# Patient Record
Sex: Female | Born: 1980 | Hispanic: No | Marital: Single | Smoking: Never smoker
Health system: Southern US, Community
[De-identification: ages and names within clinical notes are randomized; demographics above are authoritative.]

## PROBLEM LIST (undated history)

## (undated) DIAGNOSIS — J309 Allergic rhinitis, unspecified: Secondary | ICD-10-CM

## (undated) DIAGNOSIS — J45909 Unspecified asthma, uncomplicated: Secondary | ICD-10-CM

## (undated) DIAGNOSIS — E559 Vitamin D deficiency, unspecified: Secondary | ICD-10-CM

## (undated) DIAGNOSIS — F419 Anxiety disorder, unspecified: Secondary | ICD-10-CM

## (undated) HISTORY — DX: Vitamin D deficiency, unspecified: E55.9

## (undated) HISTORY — DX: Allergic rhinitis, unspecified: J30.9

## (undated) HISTORY — DX: Unspecified asthma, uncomplicated: J45.909

## (undated) HISTORY — PX: NO PAST SURGERIES: SHX2092

---

## 2015-12-23 DIAGNOSIS — J45909 Unspecified asthma, uncomplicated: Secondary | ICD-10-CM | POA: Insufficient documentation

## 2015-12-23 DIAGNOSIS — Z0000 Encounter for general adult medical examination without abnormal findings: Secondary | ICD-10-CM | POA: Insufficient documentation

## 2015-12-23 DIAGNOSIS — J040 Acute laryngitis: Secondary | ICD-10-CM | POA: Insufficient documentation

## 2016-11-30 ENCOUNTER — Encounter: Payer: Self-pay | Admitting: Allergy and Immunology

## 2016-11-30 ENCOUNTER — Ambulatory Visit (INDEPENDENT_AMBULATORY_CARE_PROVIDER_SITE_OTHER): Payer: 59 | Admitting: Allergy and Immunology

## 2016-11-30 VITALS — BP 110/78 | HR 80 | Temp 97.8°F | Resp 16 | Ht 60.0 in | Wt 157.0 lb

## 2016-11-30 DIAGNOSIS — J309 Allergic rhinitis, unspecified: Secondary | ICD-10-CM

## 2016-11-30 DIAGNOSIS — J4540 Moderate persistent asthma, uncomplicated: Secondary | ICD-10-CM

## 2016-11-30 DIAGNOSIS — H1010 Acute atopic conjunctivitis, unspecified eye: Secondary | ICD-10-CM

## 2016-11-30 MED ORDER — BUDESONIDE-FORMOTEROL FUMARATE 160-4.5 MCG/ACT IN AERO: INHALATION_SPRAY | RESPIRATORY_TRACT | 5 refills | Status: DC

## 2016-11-30 MED ORDER — FLUTICASONE PROPIONATE 50 MCG/ACT NA SUSP: NASAL | 5 refills | Status: DC

## 2016-11-30 MED ORDER — MONTELUKAST SODIUM 10 MG PO TABS: ORAL_TABLET | ORAL | 5 refills | Status: DC

## 2016-11-30 MED ORDER — OLOPATADINE HCL 0.1 % OP SOLN: OPHTHALMIC | 5 refills | Status: DC

## 2016-11-30 MED ORDER — LORATADINE 10 MG PO TABS: ORAL_TABLET | ORAL | 5 refills | Status: DC

## 2016-11-30 NOTE — Patient Instructions (Addendum)
  1. Allergen avoidance measures  2. Start immunotherapy  3. Treat and prevent inflammation:   A. Symbicort 160 - 2 inhalations twice a day with spacer  B. Flonase 1 spray each nostril twice a day  C. montelukast 10 mg tablet once a day  4. If needed:   A. Proventil HFA or similar 2 inhalations every 4-6 hours   B. Claritin or cetirizine or Allegra  C. Patanol one drop each eye 1-2 times per day  5. Return to clinic summer 2018 or earlier if problem

## 2016-11-30 NOTE — Progress Notes (Signed)
NEW PATIENT NOTE  Referring Provider: No ref. provider found Primary Provider: No PCP Per Patient Date of office visit: 11/30/2016    Subjective:   Chief Complaint:  Stacey Savage (DOB: 11-Jan-1981) is a 36 y.o. female who presents to the clinic on 11/30/2016 with a chief complaint of Establish Care (Had previous allergist in [place] ); Allergic Rhinitis ; and Asthma .  HPI: Stacey Savage presents to this clinic in evaluation of asthma and allergic rhinoconjunctivitis that was evaluated and treated with immunotherapy while living in [place]. Apparently she had a several year history of intermittent problems with asthma and allergic rhinitis not requiring a tremendous amount of treatment until 2017. 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 She was evaluated by an allergist and started on immunotherapy sometime in the summer. She's only had about 3 months of immunotherapy and then she moved to the [place] area in September 2017.  She is now working at the shelter immunizing animals. She does note that she has problems especially with exposure to cats regarding both her eyes and her nose and her chest. She continues to use her anti-inflammatory medications on a relatively consistent basis although she did run out of insurance for a period in time and was unable to obtain her controller agents which resulted in some significant use of her short acting bronchodilator.  She is interested in restarting her immunotherapy as they did appear to help especially with animal exposure.  Past Medical History:  Diagnosis Date  . Allergic rhinitis   . Asthma   . Vitamin D deficiency     History reviewed. No pertinent surgical history.  Allergies as of 11/30/2016   No Known  Allergies     Medication List      b complex vitamins capsule Take 1 capsule by mouth daily.   BIOTIN PO Take by mouth.   FISH OIL PO Take by mouth.   FLONASE 50 MCG/ACT nasal spray Generic drug:  fluticasone Place 1 spray into both nostrils daily.   PRENATAL VITAMIN PO Take by mouth.   PROAIR HFA 108 (90 Base) MCG/ACT inhaler Generic drug:  albuterol Inhale 2 puffs into the lungs every 4 (four) hours as needed for wheezing or shortness of breath.   SYMBICORT 80-4.5 MCG/ACT inhaler Generic drug:  budesonide-formoterol Inhale 2 puffs into the lungs daily as needed.       Review of systems negative except as noted in HPI / PMHx or noted below:  Review of Systems  Constitutional: Negative.   HENT: Negative.   Eyes: Negative.   Respiratory: Negative.   Cardiovascular: Negative.   Gastrointestinal: Negative.   Genitourinary: Negative.   Musculoskeletal: Negative.   Skin: Negative.   Neurological: Negative.   Endo/Heme/Allergies: Negative.   Psychiatric/Behavioral: Negative.     Family History  Problem Relation Age of Onset  . Hypertension Father   . Diabetes Father   . Heart disease Father   . Allergic rhinitis Sister   . Asthma Sister     Social History   Social History  . Marital status: Single    Spouse name: N/A  . Number of children: N/A  . Years of education: N/A   Occupational History  . Not on file.   Social History Main Topics  .  Smoking status: Never Smoker  . Smokeless tobacco: Never Used  . Alcohol use Not on file  . Drug use: Unknown  . Sexual activity: Not on file   Other Topics Concern  . Not on file   Social History Narrative  . No narrative on file    Environmental and Social history  Lives in a apartment with a dry environment, a dog and rabbit located inside the household, file in the bedroom, plastic on the bed and the pillow, and no smoking ongoing inside the household. She works as a Engineer, structural.  Objective:   Vitals:   11/30/16 0843  BP: 110/78  Pulse: 80  Resp: 16  Temp: 97.8 F (36.6 C)   Height: 5' (152.4 cm) Weight: 157 lb (71.2 kg)  Physical Exam  Constitutional: She is well-developed, well-nourished, and in no distress.  HENT:  Head: Normocephalic. Head is without right periorbital erythema and without left periorbital erythema.  Right Ear: Tympanic membrane, external ear and ear canal normal.  Left Ear: Tympanic membrane, external ear and ear canal normal.  Nose: Mucosal edema present. No rhinorrhea.  Mouth/Throat: Oropharynx is clear and moist and mucous membranes are normal. No oropharyngeal exudate.  Eyes: Conjunctivae and lids are normal. Pupils are equal, round, and reactive to light.  Neck: Trachea normal. No tracheal deviation present. No thyromegaly present.  Cardiovascular: Normal rate, regular rhythm, S1 normal, S2 normal and normal heart sounds.   No murmur heard. Pulmonary/Chest: Effort normal. No stridor. No tachypnea. No respiratory distress. She has no wheezes. She has no rales. She exhibits no tenderness.  Abdominal: Soft. She exhibits no distension and no mass. There is no hepatosplenomegaly. There is no tenderness. There is no rebound and no guarding.  Musculoskeletal: She exhibits no edema or tenderness.  Lymphadenopathy:       Head (right side): No tonsillar adenopathy present.       Head (left side): No tonsillar adenopathy present.    She has no cervical adenopathy.    She has no axillary adenopathy.  Neurological: She is alert. Gait normal.  Skin: No rash noted. She is not diaphoretic. No erythema. No pallor. Nails show no clubbing.  Psychiatric: Mood and affect normal.    Diagnostics: Allergy skin tests were performed. She demonstrated hypersensitivity against house dust mite and cat and dog and ragweed and rabbit.  Spirometry was performed and demonstrated an FEV1 of 2.44 @ 88 % of predicted.  Assessment and Plan:     1. Asthma, moderate persistent, well-controlled   2. Allergic rhinoconjunctivitis     1. Allergen avoidance measures  2. Start immunotherapy  3. Treat and prevent inflammation:   A. Symbicort 160 - 2 inhalations twice a day with spacer  B. Flonase 1 spray each nostril twice a day  C. montelukast 10 mg tablet once a day  4. If needed:   A. Proventil HFA or similar 2 inhalations every 4-6 hours   B. Claritin or cetirizine or Allegra  C. Patanol one drop each eye 1-2 times per day  5. Return to clinic summer 2018 or earlier if problem  I will have Anelisse restart immunotherapy directed against animals including mammals and dust mite. She did not really demonstrate a tremendous amount of hypersensitivity against pollens at this point although we will include ragweed with her extract as she did demonstrate some hypersensitivity to that pollen. She'll continue on anti-inflammatory medications as noted above and I'll see her back in this clinic in the summer  of 2018 or earlier if there is a problem.  Laurette Schimke, MD Allergy / Immunology [facility]

## 2016-12-09 ENCOUNTER — Other Ambulatory Visit: Payer: Self-pay | Admitting: Allergy and Immunology

## 2016-12-09 DIAGNOSIS — J3089 Other allergic rhinitis: Secondary | ICD-10-CM | POA: Diagnosis not present

## 2016-12-09 NOTE — Progress Notes (Signed)
Vials to be made on 12/09/16 DG/JM

## 2016-12-10 DIAGNOSIS — J3081 Allergic rhinitis due to animal (cat) (dog) hair and dander: Secondary | ICD-10-CM | POA: Diagnosis not present

## 2016-12-20 ENCOUNTER — Ambulatory Visit: Payer: 59 | Admitting: *Deleted

## 2016-12-20 ENCOUNTER — Ambulatory Visit (INDEPENDENT_AMBULATORY_CARE_PROVIDER_SITE_OTHER): Payer: 59 | Admitting: Allergy

## 2016-12-20 ENCOUNTER — Encounter: Payer: Self-pay | Admitting: Allergy

## 2016-12-20 VITALS — BP 128/70 | HR 76 | Resp 18

## 2016-12-20 DIAGNOSIS — H1010 Acute atopic conjunctivitis, unspecified eye: Secondary | ICD-10-CM

## 2016-12-20 DIAGNOSIS — J309 Allergic rhinitis, unspecified: Secondary | ICD-10-CM

## 2016-12-20 DIAGNOSIS — T7840XD Allergy, unspecified, subsequent encounter: Secondary | ICD-10-CM | POA: Diagnosis not present

## 2016-12-20 MED ORDER — EPINEPHRINE 0.3 MG/0.3ML IJ SOAJ: 0.3000 mg | Freq: Once | INTRAMUSCULAR | 1 refills | Status: AC

## 2016-12-20 NOTE — Progress Notes (Signed)
Follow-up Note  RE: Stacey Savage MRN: [id number] DOB: Oct 30, 1980 Date of Office Visit: 12/20/2016   History of present illness: Stacey Savage is a 36 y.o. female presenting today for reaction following first allergy shot injections today of blue vial 0.05 ML's of Cat/dog/rabbit and mite/ragweed.  She states her throat felt itchy which was leading her to cough however she denied any trouble swallowing or difficulty breathing or wheezing.  She denies any rash, N/V or lightheadedness or dizziness.  She reports she does have a history of asthma.  She did take her claritin this morning.  She had previously tolerated her immunotherapy while she was living in [place].  Her vitals were obtained initially and was normal and she had subsequent vital signs that remains normal.  She was examined without any objective findings.  She was given allegra and continued to cough thus albuterol neb was provided.  She reported resolution of symptoms following albuterol neb.  She reports that she was feeling better and her throat itch has resolved and she was monitored for a total of 45 minutes and was able to discharge home.       Review of systems: Review of Systems  Constitutional: Negative for chills, fever and malaise/fatigue.  HENT: Positive for sore throat. Negative for congestion, ear discharge, ear pain, nosebleeds, sinus pain and tinnitus.   Respiratory: Positive for cough. Negative for sputum production, shortness of breath and wheezing.   Cardiovascular: Negative for chest pain.  Gastrointestinal: Negative for abdominal pain, diarrhea, heartburn, nausea and vomiting.  Skin: Negative for itching and rash.  Neurological: Negative for focal weakness, loss of consciousness and headaches.    All other systems negative unless noted above in HPI  Past medical/social/surgical/family history have been reviewed and are unchanged unless specifically indicated below.  No changes  Medication  List: Allergies as of 12/20/2016   No Known Allergies     Medication List       Accurate as of 12/20/16  1:44 PM. Always use your most recent med list.          b complex vitamins capsule Take 1 capsule by mouth daily.   BIOTIN PO Take by mouth.   budesonide-formoterol 160-4.5 MCG/ACT inhaler Commonly known as:  SYMBICORT Inhale two puffs twice daily to prevent cough or wheeze. Rinse mouth after use. Use with spacer.   EPINEPHrine 0.3 mg/0.3 mL Soaj injection Commonly known as:  EPI-PEN Inject 0.3 mLs (0.3 mg total) into the muscle once.   FISH OIL PO Take by mouth.   fluticasone 50 MCG/ACT nasal spray Commonly known as:  FLONASE Use one spray in each nostril twice daily   loratadine 10 MG tablet Commonly known as:  CLARITIN Take one tablet once daily if needed   montelukast 10 MG tablet Commonly known as:  SINGULAIR Take one tablet once daily as directed   olopatadine 0.1 % ophthalmic solution Commonly known as:  PATANOL Use one drop in each eye once or twice daily if needed   PRENATAL VITAMIN PO Take by mouth.   PROAIR HFA 108 (90 Base) MCG/ACT inhaler Generic drug:  albuterol Inhale 2 puffs into the lungs every 4 (four) hours as needed for wheezing or shortness of breath.       Known medication allergies: No Known Allergies   Physical examination: Blood pressure 128/70, pulse 76, resp. rate 18, SpO2 99 %.  Subsequent: 120/70, 78, 18, 98%  122/68, 76, 18, 98%  General: Alert, interactive, in no acute distress. HEENT: TMs pearly gray, turbinates mildly edematous without discharge, post-pharynx non erythematous without edema or exudates. Neck: Supple without lymphadenopathy. Lungs: Clear to auscultation without wheezing, rhonchi or rales. {no increased work of breathing. CV: Normal S1, S2 without murmurs. Abdomen: Nondistended, nontender. Skin: Warm and dry, without lesions or rashes. Extremities:  No clubbing, cyanosis or  edema. Neuro:   Grossly intact.  Diagnositics/Labs: None today  Assessment and plan:   Allergic reaction following initial allergy shot injection       - reaction involved throat pruritus with objective findings as well a cough.  Symptoms resolved following administration of Allegra and albuterol nebulizer.  She has normal vitals throughout.        - we will decre ase concentration to silver vial and continue current build-up schedule      - advise to take extra dose of your antihistamine on days of your allergy shots and take 30 minutes-1hr prior to your injection     - make sure to bring your epipen on days of your allergy shots     - Continue your daily regular morning dose of your Claritin  Continue routine care as per Dr. Lucie Leather:   1. Allergen avoidance measures  2. Weekly immunotherapy  3. Treat and prevent inflammation:   A. Symbicort 160 - 2 inhalations twice a day with spacer  B. Flonase 1 spray each nostril twice a day  C. montelukast 10 mg tablet once a day  4. If needed:   A. Proventil HFA or similar 2 inhalations every 4-6 hours   B. Claritin or cetirizine or Allegra  C. Patanol one drop each eye 1-2 times per day  5. Return to clinic summer 2018 or earlier if problem    I appreciate the opportunity to take part in Mckell's care. Please do not hesitate to contact me with questions.  Sincerely,   Margo Aye, MD Allergy/Immunology [facility]

## 2016-12-20 NOTE — Progress Notes (Signed)
Immunotherapy   Patient Details  Name: Stacey Savage MRN: [id number] Date of Birth: 07-May-1981  12/20/2016  [name] started injections for  CAT-DOG-RABBIT & MITE-RAGWEED. Following schedule: B  Frequency:2 times per week Epi-Pen:Epi-Pen Available  Consent signed and patient instructions given. Patient started having itchy throat and coughing about 10 minutes after her injections. Placed patient in a room for Dr. Delorse Lek to assess and observe. Per Dr. Delorse Lek decrease patient to Silver 1:2million and continue schedule B.    [name] 12/20/2016, 12:07 PM

## 2016-12-20 NOTE — Patient Instructions (Signed)
Reaction following initial allergy shot injection       - we will decrease concentration to silver vial and continue current build-up schedule      - advise to take extra dose of your antihistamine on days of your allergy shots and take 30 minutes-1hr prior to your injection     - make sure to bring your epipen on days of your allergy shots     - Continue your daily regular morning dose of your Claritin      Continue routine care as per Dr. Lucie Leather:   1. Allergen avoidance measures  2. Weekly immunotherapy  3. Treat and prevent inflammation:   A. Symbicort 160 - 2 inhalations twice a day with spacer  B. Flonase 1 spray each nostril twice a day  C. montelukast 10 mg tablet once a day  4. If needed:   A. Proventil HFA or similar 2 inhalations every 4-6 hours   B. Claritin or cetirizine or Allegra  C. Patanol one drop each eye 1-2 times per day  5. Return to clinic summer 2018 or earlier if problem

## 2016-12-24 ENCOUNTER — Encounter: Payer: Self-pay | Admitting: *Deleted

## 2016-12-27 ENCOUNTER — Ambulatory Visit (INDEPENDENT_AMBULATORY_CARE_PROVIDER_SITE_OTHER): Payer: 59

## 2016-12-27 DIAGNOSIS — J309 Allergic rhinitis, unspecified: Secondary | ICD-10-CM

## 2017-01-03 ENCOUNTER — Ambulatory Visit (INDEPENDENT_AMBULATORY_CARE_PROVIDER_SITE_OTHER): Payer: 59

## 2017-01-03 DIAGNOSIS — J309 Allergic rhinitis, unspecified: Secondary | ICD-10-CM

## 2017-01-10 ENCOUNTER — Ambulatory Visit (INDEPENDENT_AMBULATORY_CARE_PROVIDER_SITE_OTHER): Payer: 59 | Admitting: *Deleted

## 2017-01-10 DIAGNOSIS — J309 Allergic rhinitis, unspecified: Secondary | ICD-10-CM

## 2017-01-18 ENCOUNTER — Ambulatory Visit (INDEPENDENT_AMBULATORY_CARE_PROVIDER_SITE_OTHER): Payer: 59

## 2017-01-18 DIAGNOSIS — J309 Allergic rhinitis, unspecified: Secondary | ICD-10-CM | POA: Diagnosis not present

## 2017-01-24 ENCOUNTER — Ambulatory Visit (INDEPENDENT_AMBULATORY_CARE_PROVIDER_SITE_OTHER): Payer: 59

## 2017-01-24 DIAGNOSIS — J309 Allergic rhinitis, unspecified: Secondary | ICD-10-CM

## 2017-01-31 ENCOUNTER — Ambulatory Visit (INDEPENDENT_AMBULATORY_CARE_PROVIDER_SITE_OTHER): Payer: 59

## 2017-01-31 DIAGNOSIS — J309 Allergic rhinitis, unspecified: Secondary | ICD-10-CM | POA: Diagnosis not present

## 2017-02-07 ENCOUNTER — Ambulatory Visit (INDEPENDENT_AMBULATORY_CARE_PROVIDER_SITE_OTHER): Payer: 59 | Admitting: *Deleted

## 2017-02-07 DIAGNOSIS — J309 Allergic rhinitis, unspecified: Secondary | ICD-10-CM

## 2017-02-15 ENCOUNTER — Ambulatory Visit (INDEPENDENT_AMBULATORY_CARE_PROVIDER_SITE_OTHER): Payer: 59 | Admitting: *Deleted

## 2017-02-15 DIAGNOSIS — J309 Allergic rhinitis, unspecified: Secondary | ICD-10-CM

## 2017-02-25 ENCOUNTER — Ambulatory Visit (INDEPENDENT_AMBULATORY_CARE_PROVIDER_SITE_OTHER): Payer: 59

## 2017-02-25 DIAGNOSIS — J309 Allergic rhinitis, unspecified: Secondary | ICD-10-CM

## 2017-02-28 ENCOUNTER — Ambulatory Visit (INDEPENDENT_AMBULATORY_CARE_PROVIDER_SITE_OTHER): Payer: 59

## 2017-02-28 DIAGNOSIS — J309 Allergic rhinitis, unspecified: Secondary | ICD-10-CM | POA: Diagnosis not present

## 2017-03-07 ENCOUNTER — Ambulatory Visit (INDEPENDENT_AMBULATORY_CARE_PROVIDER_SITE_OTHER): Payer: 59 | Admitting: *Deleted

## 2017-03-07 DIAGNOSIS — J309 Allergic rhinitis, unspecified: Secondary | ICD-10-CM

## 2017-03-15 ENCOUNTER — Ambulatory Visit (INDEPENDENT_AMBULATORY_CARE_PROVIDER_SITE_OTHER): Payer: 59 | Admitting: *Deleted

## 2017-03-15 DIAGNOSIS — J309 Allergic rhinitis, unspecified: Secondary | ICD-10-CM

## 2017-03-22 ENCOUNTER — Ambulatory Visit (INDEPENDENT_AMBULATORY_CARE_PROVIDER_SITE_OTHER): Payer: 59 | Admitting: *Deleted

## 2017-03-22 DIAGNOSIS — J309 Allergic rhinitis, unspecified: Secondary | ICD-10-CM | POA: Diagnosis not present

## 2017-03-28 ENCOUNTER — Ambulatory Visit (INDEPENDENT_AMBULATORY_CARE_PROVIDER_SITE_OTHER): Payer: 59

## 2017-03-28 DIAGNOSIS — J309 Allergic rhinitis, unspecified: Secondary | ICD-10-CM

## 2017-04-04 ENCOUNTER — Ambulatory Visit (INDEPENDENT_AMBULATORY_CARE_PROVIDER_SITE_OTHER): Payer: 59

## 2017-04-04 DIAGNOSIS — J309 Allergic rhinitis, unspecified: Secondary | ICD-10-CM

## 2017-04-11 ENCOUNTER — Ambulatory Visit (INDEPENDENT_AMBULATORY_CARE_PROVIDER_SITE_OTHER): Payer: 59 | Admitting: *Deleted

## 2017-04-11 DIAGNOSIS — J309 Allergic rhinitis, unspecified: Secondary | ICD-10-CM

## 2017-04-18 ENCOUNTER — Ambulatory Visit (INDEPENDENT_AMBULATORY_CARE_PROVIDER_SITE_OTHER): Payer: 59

## 2017-04-18 DIAGNOSIS — J309 Allergic rhinitis, unspecified: Secondary | ICD-10-CM | POA: Diagnosis not present

## 2017-04-28 ENCOUNTER — Ambulatory Visit: Payer: Self-pay | Admitting: *Deleted

## 2017-05-02 DIAGNOSIS — J45909 Unspecified asthma, uncomplicated: Secondary | ICD-10-CM | POA: Insufficient documentation

## 2017-05-09 ENCOUNTER — Telehealth: Payer: Self-pay

## 2017-05-09 NOTE — Telephone Encounter (Signed)
Advised patient of Dr Lucie Leather instructions and she had questions regarding her meds.  Per Dr Lucie Leather made appt for patient next week to discuss same.

## 2017-05-09 NOTE — Telephone Encounter (Signed)
Patient called to let us know she is pregnant. She is currently on Blue 1:100,000 (C-D-Rabbit and M-RW). Last inj was given on 04/18/17 02 at .35cc Would you like for [place] to freeze at this dose?

## 2017-05-09 NOTE — Telephone Encounter (Signed)
L/M for patient to contact office to advise Dr Kathyrn Lass instructions

## 2017-05-09 NOTE — Telephone Encounter (Signed)
Please inform patient that her immunotherapy is very low dose and if we "freeze" at this dose she will really did not receive much benefit and it would be best for her to complete her pregnancy and then restart immunotherapy.

## 2017-05-17 ENCOUNTER — Encounter: Payer: Self-pay | Admitting: Allergy and Immunology

## 2017-05-17 ENCOUNTER — Ambulatory Visit (INDEPENDENT_AMBULATORY_CARE_PROVIDER_SITE_OTHER): Payer: 59 | Admitting: Allergy and Immunology

## 2017-05-17 VITALS — BP 116/68 | HR 72 | Resp 18

## 2017-05-17 DIAGNOSIS — H1045 Other chronic allergic conjunctivitis: Secondary | ICD-10-CM | POA: Diagnosis not present

## 2017-05-17 DIAGNOSIS — J4540 Moderate persistent asthma, uncomplicated: Secondary | ICD-10-CM

## 2017-05-17 DIAGNOSIS — J3089 Other allergic rhinitis: Secondary | ICD-10-CM

## 2017-05-17 DIAGNOSIS — Z3A09 9 weeks gestation of pregnancy: Secondary | ICD-10-CM

## 2017-05-17 DIAGNOSIS — H1010 Acute atopic conjunctivitis, unspecified eye: Secondary | ICD-10-CM

## 2017-05-17 MED ORDER — MONTELUKAST SODIUM 10 MG PO TABS: ORAL_TABLET | ORAL | 5 refills | Status: DC

## 2017-05-17 MED ORDER — BUDESONIDE 180 MCG/ACT IN AEPB: INHALATION_SPRAY | RESPIRATORY_TRACT | 5 refills | Status: DC

## 2017-05-17 NOTE — Patient Instructions (Signed)
  1. Continue to perform Allergen avoidance measures  2. Discontinue immunotherapy  3. Treat and prevent inflammation:   A. Pulmicort 180 - 2 inhalations 1-2 times per day depending on asthma activity  B. OTC Rhinocort 1 spray each nostril one a day  C. montelukast 10 mg tablet once a day  4. If needed:   A. Proventil HFA or similar 2 inhalations every 4-6 hours   B. Claritin or cetirizine    5. Return to clinic in 12 weeks or earlier if problem  6. Obtain fall flu vaccine

## 2017-05-17 NOTE — Progress Notes (Signed)
Follow-up Note  Referring Provider: No ref. provider found Primary Provider: Patient, No Pcp Per Date of Office Visit: 05/17/2017  Subjective:   Stacey Savage (DOB: Oct 18, 1980) is a 36 y.o. female [facility] in re-evaluation of the following:  HPI: Stacey Savage returns to this clinic in reevaluation of her asthma and allergic rhinoconjunctivitis. I last saw her in this clinic during the spring of 2018 at which point in time she started a course of immunotherapy.  Her initial therapy was complicated by the fact that she appeared to have a systemic reaction with her initial injection. She was thus diluted down to a 1-1,000,000 concentration and has been slowly building up without any adverse effect.  Her asthma and her allergic rhinoconjunctivitis have been under excellent control at this point while consistently using her medical therapy and she has not required either a systemic steroid or antibiotic to treat any type of problem with her respiratory tract. Rarely does she use a short acting bronchodilator and she can exercise without any problem.  However, she is now [redacted] weeks pregnant and she discontinued her Symbicort and Flonase last week when she found out. She has developed a little bit of cough and some stuffy nose over the course the past 48 hours.  Allergies as of 05/17/2017   No Known Allergies     Medication List      b complex vitamins capsule Take 1 capsule by mouth daily.   BIOTIN PO Take by mouth.   budesonide-formoterol 160-4.5 MCG/ACT inhaler Commonly known as:  SYMBICORT Inhale two puffs twice daily to prevent cough or wheeze. Rinse mouth after use. Use with spacer.   FISH OIL PO Take by mouth.   fluticasone 50 MCG/ACT nasal spray Commonly known as:  FLONASE Use one spray in each nostril twice daily   loratadine 10 MG tablet Commonly known as:  CLARITIN Take one tablet once daily if needed   montelukast  10 MG tablet Commonly known as:  SINGULAIR Take one tablet once daily as directed   olopatadine 0.1 % ophthalmic solution Commonly known as:  PATANOL Use one drop in each eye once or twice daily if needed   PRENATAL VITAMIN PO Take by mouth.   PROAIR HFA 108 (90 Base) MCG/ACT inhaler Generic drug:  albuterol Inhale 2 puffs into the lungs every 4 (four) hours as needed for wheezing or shortness of breath.       Past Medical History:  Diagnosis Date  . Allergic rhinitis   . Asthma   . Vitamin D deficiency     History reviewed. No pertinent surgical history.  Review of systems negative except as noted in HPI / PMHx or noted below:  Review of Systems  Constitutional: Negative.   HENT: Negative.   Eyes: Negative.   Respiratory: Negative.   Cardiovascular: Negative.   Gastrointestinal: Negative.   Genitourinary: Negative.   Musculoskeletal: Negative.   Skin: Negative.   Neurological: Negative.   Endo/Heme/Allergies: Negative.   Psychiatric/Behavioral: Negative.      Objective:   Vitals:   05/17/17 1016  BP: 116/68  Pulse: 72  Resp: 18  SpO2: 98%          Physical Exam  Constitutional: She is well-developed, well-nourished, and in no distress.  HENT:  Head: Normocephalic.  Right Ear: Tympanic membrane, external ear and ear canal normal.  Left Ear: Tympanic membrane, external ear and ear canal normal.  Nose: Nose normal. No  mucosal edema or rhinorrhea.  Mouth/Throat: Uvula is midline, oropharynx is clear and moist and mucous membranes are normal. No oropharyngeal exudate.  Eyes: Conjunctivae are normal.  Neck: Trachea normal. No tracheal tenderness present. No tracheal deviation present. No thyromegaly present.  Cardiovascular: Normal rate, regular rhythm, S1 normal, S2 normal and normal heart sounds.   No murmur heard. Pulmonary/Chest: Breath sounds normal. No stridor. No respiratory distress. She has no wheezes. She has no rales.  Musculoskeletal: She  exhibits no edema.  Lymphadenopathy:       Head (right side): No tonsillar adenopathy present.       Head (left side): No tonsillar adenopathy present.    She has no cervical adenopathy.  Neurological: She is alert. Gait normal.  Skin: No rash noted. She is not diaphoretic. No erythema. Nails show no clubbing.  Psychiatric: Mood and affect normal.    Diagnostics:    Spirometry was performed and demonstrated an FEV1 of 2.51 at 91 % of predicted.  The patient had an Asthma Control Test with the following results: ACT Total Score: 22.    Assessment and Plan:   1. Asthma, moderate persistent, well-controlled   2. Other allergic rhinitis   3. Seasonal allergic conjunctivitis   4. [redacted] weeks gestation of pregnancy      1. Continue to perform Allergen avoidance measures  2. Discontinue immunotherapy  3. Treat and prevent inflammation:   A. Pulmicort 180 - 2 inhalations 1-2 times per day depending on asthma activity  B. OTC Rhinocort 1 spray each nostril one a day  C. montelukast 10 mg tablet once a day  4. If needed:   A. Proventil HFA or similar 2 inhalations every 4-6 hours   B. Claritin or cetirizine    5. Return to clinic in 12 weeks or earlier if problem  6. Obtain fall flu vaccine  Stacey Savage appears to be doing well but since she has stopped her anti-inflammatory medications for her respiratory tract she has become somewhat symptomatic. I will reinitiate anti-inflammatory medications for her respiratory tract but select class B pregnancy medications as noted above. Assuming she does well I will see her back in this clinic in 12 weeks or earlier if there is a problem. We need to discontinue her immunotherapy not because of her pregnancy directly but because she is at very low dose of extract concentration and is not really going to receive any benefit while we hold this dose through her pregnancy. She can restart after her pregnancy is completed.  Laurette Schimke, MD Allergy /  Immunology [facility]

## 2017-06-21 ENCOUNTER — Other Ambulatory Visit: Payer: Self-pay | Admitting: Allergy and Immunology

## 2017-06-23 ENCOUNTER — Other Ambulatory Visit: Payer: Self-pay | Admitting: Allergy and Immunology

## 2017-07-28 ENCOUNTER — Other Ambulatory Visit: Payer: Self-pay | Admitting: Allergy and Immunology

## 2017-11-16 ENCOUNTER — Other Ambulatory Visit: Payer: Self-pay | Admitting: Allergy and Immunology

## 2017-11-16 NOTE — Telephone Encounter (Signed)
Courtesy refill  

## 2017-11-20 ENCOUNTER — Other Ambulatory Visit: Payer: Self-pay | Admitting: Allergy and Immunology

## 2017-12-17 ENCOUNTER — Other Ambulatory Visit: Payer: Self-pay | Admitting: Allergy and Immunology

## 2018-01-26 ENCOUNTER — Other Ambulatory Visit: Payer: Self-pay | Admitting: Allergy and Immunology

## 2018-05-01 DIAGNOSIS — N8110 Cystocele, unspecified: Secondary | ICD-10-CM | POA: Insufficient documentation

## 2018-11-21 ENCOUNTER — Other Ambulatory Visit: Payer: Self-pay

## 2018-11-21 ENCOUNTER — Encounter: Payer: Self-pay | Admitting: Allergy and Immunology

## 2018-11-21 ENCOUNTER — Ambulatory Visit (INDEPENDENT_AMBULATORY_CARE_PROVIDER_SITE_OTHER): Payer: 59 | Admitting: Allergy and Immunology

## 2018-11-21 DIAGNOSIS — J3089 Other allergic rhinitis: Secondary | ICD-10-CM | POA: Diagnosis not present

## 2018-11-21 DIAGNOSIS — J4540 Moderate persistent asthma, uncomplicated: Secondary | ICD-10-CM

## 2018-11-21 MED ORDER — ALBUTEROL SULFATE HFA 108 (90 BASE) MCG/ACT IN AERS: 2.0000 | INHALATION_SPRAY | RESPIRATORY_TRACT | 1 refills | Status: DC | PRN

## 2018-11-21 MED ORDER — PREDNISONE 10 MG PO TABS: ORAL_TABLET | ORAL | 0 refills | Status: DC

## 2018-11-21 MED ORDER — BUDESONIDE-FORMOTEROL FUMARATE 160-4.5 MCG/ACT IN AERO: INHALATION_SPRAY | RESPIRATORY_TRACT | 5 refills | Status: DC

## 2018-11-21 NOTE — Patient Instructions (Addendum)
  1. Continue to perform Allergen avoidance measures as best as possible  2. Treat and prevent inflammation:   A. Symbicort 160 - 2 inhalations 2 times per day depending on asthma activity  B. OTC Rhinocort 1 spray each nostril one a day   3. If needed:   A. Albuterol HFA - 2 inhalations every 4-6 hours   B. Claritin 10 - 1 tablet 1-2 times per day   4. Prednisone 10mg  tablet - 1 tablet 1 time per day for 10 days only  5. Return to clinic in summer 2020 or earlier if problem  6. Consider restarting immunotherapy

## 2018-11-21 NOTE — Progress Notes (Signed)
[place] - [place] - [place] - [place] - [place]   Follow-up Note  Referring Provider: No ref. provider found Primary Provider: Gillis Santa, MD Date of Office Visit: 11/21/2018  Subjective:   Stacey Savage (DOB: [birth year]) is a 38 y.o. female who [facility] in re-evaluation of the following:  HPI: This is a tele-med contact requested by Eber Jones from her home.  She is followed in this clinic for asthma and allergic rhinitis and allergic conjunctivitis.  Her last visit to this clinic was 17 May 2017.  For the most part she has done relatively well.  She has completed a pregnancy and has had a delivery and her baby is now 36 months old and is very healthy other than having some infectious issues with daycare.  She did not really have a tremendous amount of issues involving her airway and rarely used any medications on a regular basis for prolonged period in time.  However, recently she has had more allergy and asthma problems. More nasal congestion and slight eye irritation but what worries her the most is coughing, chest tightness, chest heaviness, and wheezing, and use of Symbicort as needed with a frequency of 1-3 times per day which helps her lower respiratory tract symptoms.  She has been stuck in this episode for the past month.  There is no associated anosmia or headaches or ugly nasal discharge or ugly sputum production or chest pain or recurrent fevers.  Allergies as of 11/21/2018   No Known Allergies     Medication List      azelastine 0.1 % nasal spray Commonly known as:  ASTELIN Place 1 spray into both nostrils 2 (two) times daily.   b complex vitamins capsule Take 1 capsule by mouth daily.   budesonide-formoterol 160-4.5 MCG/ACT inhaler Commonly known as:  Symbicort Inhale two puffs twice daily to prevent cough or wheeze. Rinse mouth after use. Use with spacer.   fluticasone 50 MCG/ACT nasal spray  Commonly known as:  Flonase Use one spray in each nostril twice daily   loratadine 10 MG tablet Commonly known as:  CLARITIN TAKE 1 TABLET BY MOUTH EVERY DAY AS NEEDED   montelukast 10 MG tablet Commonly known as:  SINGULAIR TAKE 1 TABLET BY MOUTH EVERY DAY AS DIRECTED   olopatadine 0.1 % ophthalmic solution Commonly known as:  Patanol Use one drop in each eye once or twice daily if needed   PRENATAL VITAMIN PO Take by mouth.   ProAir HFA 108 (90 Base) MCG/ACT inhaler Generic drug:  albuterol Inhale 2 puffs into the lungs every 4 (four) hours as needed for wheezing or shortness of breath.   Vitamin D3 125 MCG (5000 UT) Tabs Take by mouth.       Past Medical History:  Diagnosis Date  . Allergic rhinitis   . Asthma   . Vitamin D deficiency     History reviewed. No pertinent surgical history.  Review of systems negative except as noted in HPI / PMHx or noted below:  Review of Systems  Constitutional: Negative.   HENT: Negative.   Eyes: Negative.   Respiratory: Negative.   Cardiovascular: Negative.   Gastrointestinal: Negative.   Genitourinary: Negative.   Musculoskeletal: Negative.   Skin: Negative.   Neurological: Negative.   Endo/Heme/Allergies: Negative.   Psychiatric/Behavioral: Negative.      Objective:   There were no vitals filed for this visit.        Physical Exam-deferred  Diagnostics: None  Assessment and Plan:   1. Not well controlled moderate persistent asthma   2. Other allergic rhinitis      1. Continue to perform Allergen avoidance measures as best as possible  2. Treat and prevent inflammation:   A. Symbicort 160 - 2 inhalations 2 times per day depending on asthma activity  B. OTC Rhinocort 1 spray each nostril one a day   3. If needed:   A. Albuterol HFA - 2 inhalations every 4-6 hours   B. Claritin 10 - 1 tablet 1-2 times per day   4. Prednisone 10mg  tablet - 1 tablet 1 time per day for 10 days only  5. Return to  clinic in summer 2020 or earlier if problem  6. Consider restarting immunotherapy  Stacey Savage appears to be having a difficult time the spring with both her upper and lower airway and we will have her consistently use anti-inflammatory agents in a preventative manner as she goes through the spring and I have given her a relatively low dose of systemic steroids to help with her lower airway inflammation which appears to be quite flared over the course of the past month.  She will keep in contact with me noting her response to this approach.  If she does well I will see her back in this clinic in the summer 2020 or earlier if there is a problem.  Total patient interaction time 20 minutes.  Laurette Schimke, MD Allergy / Immunology [facility]

## 2018-11-21 NOTE — Progress Notes (Signed)
Televisit started 5:17pm taking the call from her car   Consent to treat and billing Yes Mychart pending sent code to activate

## 2018-11-22 ENCOUNTER — Encounter: Payer: Self-pay | Admitting: Allergy and Immunology

## 2018-11-29 ENCOUNTER — Other Ambulatory Visit: Payer: Self-pay

## 2018-11-29 ENCOUNTER — Encounter: Payer: Self-pay | Admitting: Allergy

## 2018-11-29 ENCOUNTER — Ambulatory Visit (INDEPENDENT_AMBULATORY_CARE_PROVIDER_SITE_OTHER): Payer: 59 | Admitting: Allergy

## 2018-11-29 ENCOUNTER — Telehealth: Payer: Self-pay

## 2018-11-29 VITALS — BP 120/70 | HR 92 | Temp 99.2°F | Resp 18

## 2018-11-29 DIAGNOSIS — J4540 Moderate persistent asthma, uncomplicated: Secondary | ICD-10-CM

## 2018-11-29 DIAGNOSIS — J3089 Other allergic rhinitis: Secondary | ICD-10-CM | POA: Diagnosis not present

## 2018-11-29 MED ORDER — MONTELUKAST SODIUM 10 MG PO TABS: 10.0000 mg | ORAL_TABLET | Freq: Every day | ORAL | 5 refills | Status: DC

## 2018-11-29 NOTE — Patient Instructions (Addendum)
  1. Continue to perform Allergen avoidance measures as best as possible  2. Treat and prevent inflammation:   A. Symbicort 160 - 2 inhalations 2 times per day  B. OTC Rhinocort 1 spray each nostril one a day  C. Resume use of Singulair 10mg  daily at bedtime   3. If needed:   A. Albuterol HFA - 2 inhalations every 4-6 hours   B. Claritin 10 - 1 tablet 1-2 times per day   C. If asthma symptoms not improved with adding Singulair then start Pulmicort 2 puffs twice a day until symptoms improve then can stop and continue your maintenance medications as above  4. Complete your course Prednisone 10mg  tablet - 1 tablet 1 time per day for 10 days only  5. Return to clinic in summer 2020 or earlier if problem

## 2018-11-29 NOTE — Telephone Encounter (Signed)
She needs to be seen today

## 2018-11-29 NOTE — Progress Notes (Signed)
Follow-up Note  RE: Stacey Savage MRN: [id number] DOB: 10/06/1980 Date of Office Visit: 11/29/2018   History of present illness: Stacey Savage is a 38 y.o. female presenting today for sick visit.  She has a history of moderate persistent asthma allergic rhinitis and had a telemedicine visit with Dr. Lucie LeatherKozlow on 11/21/2018 related to her asthma at which time he prescribed her prednisone 10 mg tablets take 1 tablet for 10 days.  She states she has 2 more days left today of her prednisone.  She states she did forget her dose today.  She states she has had more chest tightness today and is wondering if it is related to her missed prednisone dose.  She also states she has needed to use her albuterol more today than previous days for the chest tightness and cough.  Cough remains a dry cough.  She also endorses shortness of breath.  She denies any fevers or chills and no body aches.  She denies any known exposures to any "positive patients or any other sick people.  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  She has been taking her Symbicort 160 mcg 2 puffs twice a day during this time.  She is not on Singulair and states she has not been on Singulair in over a year or so and is not sure why she stopped the medication.    Review of systems: Review of Systems  Constitutional: Negative for chills, fever and malaise/fatigue.  HENT: Negative for congestion, ear discharge, ear pain, nosebleeds, sinus pain and sore throat.   Eyes: Negative for pain, discharge and redness.  Respiratory: Positive for cough, shortness of breath and wheezing. Negative for hemoptysis and sputum production.   Cardiovascular: Negative for chest pain.  Gastrointestinal: Negative for abdominal pain, constipation, diarrhea, heartburn, nausea and vomiting.  Musculoskeletal: Negative for myalgias.  Skin: Negative for itching and rash.  Neurological: Negative for headaches.    All other systems  negative unless noted above in HPI  Past medical/social/surgical/family history have been reviewed and are unchanged unless specifically indicated below.  No changes  Medication List: Allergies as of 11/29/2018   No Known Allergies     Medication List       Accurate as of November 29, 2018 11:59 PM. Always use your most recent med list.        albuterol 108 (90 Base) MCG/ACT inhaler Commonly known as:  Ventolin HFA Inhale 2 puffs into the lungs every 4 (four) hours as needed for wheezing or shortness of breath.   azelastine 0.1 % nasal spray Commonly known as:  ASTELIN Place 1 spray into both nostrils 2 (two) times daily.   b complex vitamins capsule Take 1 capsule by mouth daily.   budesonide-formoterol 160-4.5 MCG/ACT inhaler Commonly known as:  Symbicort Inhale two puffs twice daily to prevent cough or wheeze. Rinse mouth after use.   fluticasone 50 MCG/ACT nasal spray Commonly known as:  Flonase Use one spray in each nostril twice daily   loratadine 10 MG tablet Commonly known as:  CLARITIN TAKE 1 TABLET BY MOUTH EVERY DAY AS NEEDED   montelukast 10 MG tablet Commonly known as:  SINGULAIR TAKE 1 TABLET BY MOUTH EVERY DAY AS DIRECTED   montelukast 10 MG tablet Commonly known as:  SINGULAIR Take 1 tablet (10 mg total) by mouth at bedtime.   olopatadine 0.1 % ophthalmic solution Commonly known as:  Patanol Use one drop in each eye once  or twice daily if needed   predniSONE 10 MG tablet Commonly known as:  DELTASONE Take 1 tablet daily for 10 days   PRENATAL VITAMIN PO Take by mouth.   Vitamin D3 125 MCG (5000 UT) Tabs Take by mouth.       Known medication allergies: No Known Allergies   Physical examination: Blood pressure 120/70, pulse 92, temperature 99.2 F (37.3 C), temperature source Tympanic, resp. rate 18, SpO2 98 %.  General: Alert, interactive, in no acute distress. HEENT: PERRLA, TMs pearly gray, turbinates minimally edematous without  discharge, post-pharynx non erythematous. Neck: Supple without lymphadenopathy. Lungs: Clear to auscultation without wheezing, rhonchi or rales. {no increased work of breathing.  Patient reports using albuterol about an hour prior to exam CV: Normal S1, S2 without murmurs. Abdomen: Nondistended, nontender. Skin: Warm and dry, without lesions or rashes. Extremities:  No clubbing, cyanosis or edema. Neuro:   Grossly intact.  Diagnositics/Labs: None today  Assessment and plan: Moderate persistent asthma, not well controlled  -She is still symptomatic with her current asthma flare however she does feel that prednisone has helped her symptoms and she forgot her dose today had worsening symptoms.  Today and continue to complete her prednisone course as directed.  I will add in Singulair to her regimen and see if this helps with her control and I also provided her with the Pulmicort sample to also add in more inhaled steroid board if symptoms are not improving.  -She has been afebrile and without any myalgias however advised her to continue performing the recommendations for coronavirus as outlined by the state health department and CDC. Allergic rhinitis    1. Continue to perform Allergen avoidance measures as best as possible  2. Treat and prevent inflammation:   A. Symbicort 160 - 2 inhalations 2 times per day  B. OTC Rhinocort 1 spray each nostril one a day  C. Resume use of Singulair 10mg  daily at bedtime   3. If needed:   A. Albuterol HFA - 2 inhalations every 4-6 hours   B. Claritin 10 - 1 tablet 1-2 times per day   C. If asthma symptoms not improved with adding Singulair then start Pulmicort 2 puffs twice a day until symptoms improve then can stop and continue your maintenance medications as above  4. Complete your course Prednisone 10mg  tablet - 1 tablet 1 time per day for 10 days only  5. Return to clinic in summer 2020 or earlier if problem  I appreciate the opportunity to  take part in Stacey Savage's care. Please do not hesitate to contact me with questions.  Sincerely,   Margo Aye, MD Allergy/Immunology [facility]

## 2018-11-29 NOTE — Telephone Encounter (Signed)
Patient call C/O sob and chest tightness occurring every 2 hours after giving herself the rescue inhaler every 4 to 6 hours. Patient is currently taking her Symbicort and albuterol. Patient also took her antihistamine medication this AM. I advise patient that if she could take another antihistamine to double her current dose for relief of sx and to administer a second dose of her albuterol for breakthrough sx. Patient would like further advise from Dr. Lucie Leather, but also advise if sx have not subsided she will need to go to [facility] or ER. Currently taking Prednisone daily but missed dose this AM.

## 2018-11-29 NOTE — Telephone Encounter (Signed)
Patient will be seen on Dr. Delorse Lek schedule at 4:30 pm today.

## 2018-12-04 ENCOUNTER — Telehealth: Payer: Self-pay | Admitting: Allergy

## 2018-12-04 MED ORDER — MONTELUKAST SODIUM 10 MG PO TABS: 10.0000 mg | ORAL_TABLET | Freq: Every day | ORAL | 5 refills | Status: DC

## 2018-12-04 MED ORDER — LORATADINE 10 MG PO TABS: 10.0000 mg | ORAL_TABLET | Freq: Every day | ORAL | 5 refills | Status: DC

## 2018-12-04 NOTE — Telephone Encounter (Signed)
Patient requested refills on April 8. She called today to ask which pharmacy. Prescription sent to wrong pharmacy. She needs Montelukast and Loratadine sent to [facility] on [address].

## 2018-12-15 ENCOUNTER — Ambulatory Visit: Payer: 59 | Admitting: Allergy

## 2018-12-18 NOTE — Progress Notes (Signed)
Patient is at home. Provider is in office.  Consent given.  Start Time:517 pm End Time: 533 pm

## 2018-12-20 ENCOUNTER — Other Ambulatory Visit: Payer: Self-pay | Admitting: *Deleted

## 2018-12-20 ENCOUNTER — Encounter: Payer: Self-pay | Admitting: Allergy

## 2018-12-20 ENCOUNTER — Ambulatory Visit (INDEPENDENT_AMBULATORY_CARE_PROVIDER_SITE_OTHER): Payer: 59 | Admitting: Allergy

## 2018-12-20 ENCOUNTER — Other Ambulatory Visit: Payer: Self-pay

## 2018-12-20 DIAGNOSIS — J4540 Moderate persistent asthma, uncomplicated: Secondary | ICD-10-CM | POA: Diagnosis not present

## 2018-12-20 DIAGNOSIS — J3089 Other allergic rhinitis: Secondary | ICD-10-CM

## 2018-12-20 NOTE — Progress Notes (Signed)
RE: Stacey Savage MRN: [id number] DOB: Jun 10, 1981 Date of Telemedicine Visit: 12/20/2018  Referring provider: Gillis Santa, MD Primary care provider: Gillis Santa, MD  Chief Complaint: Follow-up   Telemedicine Follow Up Visit via Telephone: I connected with Stacey Savage for a follow up on 12/21/18 by telephone and verified that I am speaking with the correct person using two identifiers.   I discussed the limitations, risks, security and privacy concerns of performing an evaluation and management service by telephone and the availability of in person appointments. I also discussed with the patient that there may be a patient responsible charge related to this service. The patient expressed understanding and agreed to proceed.  Patient is at home.  Provider is at the office.  Visit start time: 1604 Visit end time: 1623 Insurance consent/check in by: Marlene Bast Medical consent and medical assistant/nurse: Renaldo Fiddler  History of Present Illness: She is a 38 y.o. female, who is being followed for mod persistent asthma and allergic rhinitis. Her previous allergy office visit was on 11/29/18 with Dr. Delorse Lek.   At last visit she was being treated for asthma flare with prednisone.  She states she recovered from this flare and is doing well now.  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  She states she was applying for another type job that would not involve being around animals however she states it would involve being around people and she decided at this time that job would not be best suited for her.   She continues on Symbicort 2 puffs twice a day.  She states she is not needing her albuterol anymore.   She states she is not using her nasal steroid spray however.  She is taking claritin daily and singulair daily.  She states after last visit she states she did not need to add in the  pulmicort to her symbicort.    Assessment and Plan: [name] is a 37 y.o. female with:    Moderate persistent asthma  - improved from recent exacerbation.  Continue current maintenance regimen as below.  Asthma action plan will include adding in Pulmicort to maintenance Symbicort.   Allergic rhinitis  - she has constant allergen exposure on her job.  Advised she would benefit from daily antihistamine use in addition to her singulair.    1. Continue to perform Allergen avoidance measures as best as possible  2. Treat and prevent inflammation:   A. Symbicort 160 - 2 inhalations 2 times per day  B. Singulair 10mg  daily at bedtime   3. If needed:   A. Albuterol HFA - 2 inhalations every 4-6 hours   B. Claritin 10 - 1 tablet 1-2 times per day   C. OTC Rhinocort 1 spray each nostril once per day  D. During asthma flares add Pulmicort 2 puffs twice a day until symptoms improve then can stop and continue your maintenance medications as above  6. Return to clinic in summer 2020 or earlier if problem   Diagnostics: None.  Medication List:  Current Outpatient Medications  Medication Sig Dispense Refill   albuterol (VENTOLIN HFA) 108 (90 Base) MCG/ACT inhaler Inhale 2 puffs into the lungs every 4 (four) hours as needed for wheezing or shortness of breath. 1 Inhaler 1   b complex vitamins capsule Take 1 capsule by mouth daily.     budesonide-formoterol (SYMBICORT) 160-4.5 MCG/ACT inhaler Inhale two puffs twice  daily to prevent cough or wheeze. Rinse mouth after use. 1 Inhaler 5   Cholecalciferol (VITAMIN D3) 125 MCG (5000 UT) TABS Take by mouth.     fluticasone (FLONASE) 50 MCG/ACT nasal spray Use one spray in each nostril twice daily (Patient taking differently: Place 1 spray into both nostrils as needed. Use one spray in each nostril twice daily) 16 g 5   loratadine (CLARITIN) 10 MG tablet Take 1 tablet (10 mg total) by mouth daily. 30 tablet 5   montelukast (SINGULAIR) 10 MG  tablet Take 1 tablet (10 mg total) by mouth at bedtime. 30 tablet 5   Prenatal Vit-Fe Fumarate-FA (PRENATAL VITAMIN PO) Take by mouth.     Zinc 10 MG LOZG Use as directed 10 mg in the mouth or throat daily.     No current facility-administered medications for this visit.    Allergies: No Known Allergies I reviewed her past medical history, social history, family history, and environmental history and no significant changes have been reported from previous visit on 11/29/2018.  Review of Systems  Constitutional: Negative for chills and fever.  HENT: Negative for congestion, postnasal drip, rhinorrhea and sneezing.   Eyes: Negative for pain, discharge and itching.  Respiratory: Positive for cough, shortness of breath and wheezing.   Cardiovascular: Negative.   Gastrointestinal: Negative.   Musculoskeletal: Negative for myalgias.  Skin: Negative for rash.  Neurological: Negative for headaches.   Objective: Physical Exam Not obtained as encounter was done via telephone.   Previous notes and tests were reviewed.  I discussed the assessment and treatment plan with the patient. The patient was provided an opportunity to ask questions and all were answered. The patient agreed with the plan and demonstrated an understanding of the instructions.   The patient was advised to call back or seek an in-person evaluation if the symptoms worsen or if the condition fails to improve as anticipated.  I provided 19 minutes of non-face-to-face time during this encounter.  It was my pleasure to participate in Eastonaroline Perdue's care today. Please feel free to contact me with any questions or concerns.   Sincerely,  Nyleah Mcginnis Larose HiresPatricia Kateri Balch, MD

## 2018-12-20 NOTE — Patient Instructions (Addendum)
  1. Continue to perform Allergen avoidance measures as best as possible  2. Treat and prevent inflammation:   A. Symbicort 160 - 2 inhalations 2 times per day  B. Singulair 10mg  daily at bedtime   3. If needed:   A. Albuterol HFA - 2 inhalations every 4-6 hours   B. Claritin 10 - 1 tablet 1-2 times per day   C. OTC Rhinocort 1 spray each nostril once per day  D. During asthma flares add Pulmicort 2 puffs twice a day until symptoms improve then can stop and continue your maintenance medications as above  6. Return to clinic in summer 2020 or earlier if problem

## 2018-12-21 MED ORDER — MONTELUKAST SODIUM 10 MG PO TABS: 10.0000 mg | ORAL_TABLET | Freq: Every day | ORAL | 5 refills | Status: DC

## 2018-12-21 MED ORDER — BUDESONIDE-FORMOTEROL FUMARATE 160-4.5 MCG/ACT IN AERO: INHALATION_SPRAY | RESPIRATORY_TRACT | 5 refills | Status: DC

## 2019-01-22 ENCOUNTER — Other Ambulatory Visit: Payer: Self-pay | Admitting: Allergy and Immunology

## 2019-04-19 ENCOUNTER — Ambulatory Visit: Payer: 59 | Admitting: Allergy

## 2019-04-26 ENCOUNTER — Ambulatory Visit: Payer: 59 | Admitting: Allergy

## 2019-05-10 ENCOUNTER — Encounter: Payer: Self-pay | Admitting: Allergy

## 2019-05-10 ENCOUNTER — Other Ambulatory Visit: Payer: Self-pay

## 2019-05-10 ENCOUNTER — Ambulatory Visit: Payer: 59 | Admitting: Allergy

## 2019-05-10 VITALS — BP 122/60 | HR 80 | Temp 97.8°F | Resp 16 | Ht 60.0 in

## 2019-05-10 DIAGNOSIS — J4540 Moderate persistent asthma, uncomplicated: Secondary | ICD-10-CM | POA: Diagnosis not present

## 2019-05-10 DIAGNOSIS — J3089 Other allergic rhinitis: Secondary | ICD-10-CM | POA: Diagnosis not present

## 2019-05-10 NOTE — Patient Instructions (Signed)
  1. Continue to perform Allergen avoidance measures as best as possible  2. Treat and prevent inflammation:   A. Symbicort 160 - 2 inhalations 2 times per day  B. Singulair 10mg  daily at bedtime  C. Claritin 10mg  1 tablet daily (may take additional dose if needed)   3. If needed:   A. Albuterol HFA - 2 inhalations every 4-6 hours   C. OTC Rhinocort, Nasacort of Flonase 1-2 sprays each nostril once per day for nasal congestion as needed  D. During asthma flares add Pulmicort 2 puffs twice a day until symptoms improve then can stop and continue your maintenance medications as above  6. Return to clinic in 4-6 months or earlier if problem

## 2019-05-10 NOTE — Progress Notes (Signed)
Follow-up Note  RE: Stacey Savage MRN: [id number] DOB: 08-26-1980 Date of Office Visit: 05/10/2019   History of present illness: Stacey Savage is a 38 y.o. female presenting today for follow-up of asthma and allergic rhinitis.  She was last seen in telemedicine on 12/20/2018.  She states since this visit she has been doing relatively well without any major health changes, surgeries or hospitalizations.  She states that she only has issues with her asthma if she has cat exposure.  She does work as a Camera operator.  She states she may use her albuterol once a month on average.  She is on symbicort 159mcg 2 puffs twice a day and singulair daily.  She also has pulmicort to add to singulair during flares but she has not needed to add this in.   With her allergies she states there will be days when she will have more nasal congestion/drainage however she has not used her nasal spray.  She does take claritin daily.    Review of systems: Review of Systems  Constitutional: Negative for chills, fever and malaise/fatigue.  HENT: Negative for congestion, ear discharge, nosebleeds and sore throat.   Eyes: Negative for pain, discharge and redness.  Respiratory: Negative for cough, shortness of breath and wheezing.   Cardiovascular: Negative for chest pain.  Gastrointestinal: Negative for abdominal pain, constipation, diarrhea, heartburn, nausea and vomiting.  Musculoskeletal: Negative for joint pain.  Skin: Negative for itching and rash.  Neurological: Negative for headaches.    All other systems negative unless noted above in HPI  Past medical/social/surgical/family history have been reviewed and are unchanged unless specifically indicated below.  No changes  Medication List: Allergies as of 05/10/2019   No Known Allergies     Medication List       Accurate as of May 10, 2019  4:49 PM. If you have any questions, ask your nurse or doctor.        b complex vitamins capsule Take 1  capsule by mouth daily.   budesonide-formoterol 160-4.5 MCG/ACT inhaler Commonly known as: Symbicort Inhale two puffs twice daily to prevent cough or wheeze. Rinse mouth after use.   fluticasone 50 MCG/ACT nasal spray Commonly known as: Flonase Use one spray in each nostril twice daily What changed:   how much to take  how to take this  when to take this  reasons to take this   loratadine 10 MG tablet Commonly known as: CLARITIN Take 1 tablet (10 mg total) by mouth daily.   montelukast 10 MG tablet Commonly known as: SINGULAIR Take 1 tablet (10 mg total) by mouth at bedtime.   PRENATAL VITAMIN PO Take by mouth.   Ventolin HFA 108 (90 Base) MCG/ACT inhaler Generic drug: albuterol INHALE 2 PUFFS INTO THE LUNGS EVERY 4 (FOUR) HOURS AS NEEDED FOR WHEEZING OR SHORTNESS OF BREATH.   Vitamin D3 125 MCG (5000 UT) Tabs Take by mouth.   Zinc 10 MG Lozg Use as directed 10 mg in the mouth or throat daily.       Known medication allergies: No Known Allergies   Physical examination: Blood pressure 122/60, pulse 80, temperature 97.8 F (36.6 C), temperature source Temporal, resp. rate 16, height 5' (1.524 m), SpO2 97 %.  General: Alert, interactive, in no acute distress. HEENT: PERRLA, TMs pearly gray, turbinates minimally edematous without discharge, post-pharynx non erythematous. Neck: Supple without lymphadenopathy. Lungs: Clear to auscultation without wheezing, rhonchi or rales. {no increased work of breathing. CV: Normal S1, S2 without  murmurs. Abdomen: Nondistended, nontender. Skin: Warm and dry, without lesions or rashes. Extremities:  No clubbing, cyanosis or edema. Neuro:   Grossly intact.  Diagnositics/Labs:  Spirometry: FEV1: 2.13L 79%, FVC: 2.61L 81%, ratio consistent with nonobstructive pattern  Assessment and plan: Moderate persistent asthma  - Under good control at this time.  Continue current maintenance regimen as below.  Asthma action plan will  include adding in Pulmicort to maintenance Symbicort.   Allergic rhinitis  - she has constant allergen exposure on her job.  Advised she would benefit from daily antihistamine use in addition to her singulair.      1. Continue to perform Allergen avoidance measures as best as possible  2. Treat and prevent inflammation:   A. Symbicort 160 - 2 inhalations 2 times per day  B. Singulair 10mg  daily at bedtime  C. Claritin 10mg  1 tablet daily (may take additional dose if needed)   3. If needed:   A. Albuterol HFA - 2 inhalations every 4-6 hours   C. OTC Rhinocort, Nasacort of Flonase 1-2 sprays each nostril once per day for nasal congestion as needed  D. During asthma flares add Pulmicort 2 puffs twice a day until symptoms improve then can stop and continue your maintenance medications as above  6. Return to clinic in 4-6 months or earlier if problem  I appreciate the opportunity to take part in Nadiyah's care. Please do not hesitate to contact me with questions.  Sincerely,   Margo AyeShaylar Korin Setzler, MD Allergy/Immunology [facility]

## 2019-06-13 ENCOUNTER — Other Ambulatory Visit: Payer: Self-pay | Admitting: Allergy

## 2019-10-10 DIAGNOSIS — M722 Plantar fascial fibromatosis: Secondary | ICD-10-CM | POA: Insufficient documentation

## 2020-01-01 ENCOUNTER — Other Ambulatory Visit: Payer: Self-pay | Admitting: Allergy

## 2020-01-03 ENCOUNTER — Other Ambulatory Visit: Payer: Self-pay | Admitting: Allergy

## 2020-01-11 ENCOUNTER — Other Ambulatory Visit: Payer: Self-pay | Admitting: Allergy

## 2020-01-28 ENCOUNTER — Telehealth: Payer: Self-pay | Admitting: Allergy

## 2020-01-28 NOTE — Telephone Encounter (Signed)
Patient was last seen 04/2019 and was given a courtesy refill back in May 2021. They will need to schedule an office visit.

## 2020-01-28 NOTE — Telephone Encounter (Signed)
Patient called and states the pharmacy did not have anymore refills on Loratadine and Montelukast. Patient would like both medications sent to [facility] on [facility] if possible.  Please advise.

## 2020-01-31 ENCOUNTER — Ambulatory Visit: Payer: 59 | Admitting: Allergy and Immunology

## 2020-01-31 ENCOUNTER — Encounter: Payer: Self-pay | Admitting: Allergy and Immunology

## 2020-01-31 ENCOUNTER — Other Ambulatory Visit: Payer: Self-pay

## 2020-01-31 VITALS — BP 108/68 | HR 87 | Temp 98.3°F | Resp 16 | Ht 60.0 in | Wt 169.4 lb

## 2020-01-31 DIAGNOSIS — H1013 Acute atopic conjunctivitis, bilateral: Secondary | ICD-10-CM | POA: Diagnosis not present

## 2020-01-31 DIAGNOSIS — J4530 Mild persistent asthma, uncomplicated: Secondary | ICD-10-CM | POA: Diagnosis not present

## 2020-01-31 DIAGNOSIS — J302 Other seasonal allergic rhinitis: Secondary | ICD-10-CM | POA: Insufficient documentation

## 2020-01-31 DIAGNOSIS — H1010 Acute atopic conjunctivitis, unspecified eye: Secondary | ICD-10-CM | POA: Insufficient documentation

## 2020-01-31 DIAGNOSIS — J3089 Other allergic rhinitis: Secondary | ICD-10-CM

## 2020-01-31 MED ORDER — LORATADINE 10 MG PO TABS: 10.0000 mg | ORAL_TABLET | Freq: Every day | ORAL | 5 refills | Status: DC

## 2020-01-31 MED ORDER — FLUTICASONE PROPIONATE 50 MCG/ACT NA SUSP: 2.0000 | Freq: Every day | NASAL | 5 refills | Status: DC | PRN

## 2020-01-31 MED ORDER — OLOPATADINE HCL 0.2 % OP SOLN
1.0000 [drp] | Freq: Every day | OPHTHALMIC | 5 refills | Status: DC | PRN
Start: 2020-01-31 — End: 2020-07-03

## 2020-01-31 MED ORDER — MONTELUKAST SODIUM 10 MG PO TABS: 10.0000 mg | ORAL_TABLET | Freq: Every day | ORAL | 5 refills | Status: DC

## 2020-01-31 MED ORDER — AZELASTINE HCL 0.1 % NA SOLN: 1.0000 | Freq: Two times a day (BID) | NASAL | 5 refills | Status: DC | PRN

## 2020-01-31 NOTE — Assessment & Plan Note (Signed)
   Treatment plan as outlined above for allergic rhinitis.  A prescription has been provided for generic Pataday, one drop per eye daily as needed.  If insurance does not cover this medication, medicated allergy eyedrops may be purchased over-the-counter as Pataday Extra Strength or Zaditor. 

## 2020-01-31 NOTE — Progress Notes (Signed)
Follow-up Note  RE: Niajah Sipos MRN: [id number] DOB: 06/03/1981 Date of Office Visit: 01/31/2020  Primary care provider: Gillis Santa, MD Referring provider: Gillis Santa, MD  History of present illness: Leondra Cullin is a 39 y.o. female  with asthma and allergic rhinitis presenting today for follow-up.  She was last seen in clinic in September 2020.  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  As a result of being away from the animal she has been able to stop using Symbicort.  She continues to take montelukast daily.  She rarely experiences wheezing or coughing and does not experience limitations in normal daily activities or nocturnal awakenings due to lower respiratory symptoms. She has been experiencing some nasal congestion despite using Rhinocort AQ every morning.  Her allergic conjunctivitis has improved since switching occupations and not working with animals anymore.  Assessment and plan: Mild persistent asthma  Continue montelukast 10 mg daily at bedtime.  Continue albuterol HFA, 1 to 2 inhalations every 4-6 hours if needed.  During upper respiratory tract infections or asthma flares, add Symbicort 160-4.5 g, 2 inhalations via spacer device twice daily until symptoms have returned to baseline.  Subjective and objective measures of pulmonary function will be followed and the treatment plan will be adjusted accordingly.  Allergic rhinitis  Currently with suboptimal control.  Continue appropriate allergen avoidance measures.  A prescription has been provided for azelastine/fluticasone nasal spray, 1 spray per nostril twice daily as needed. Proper nasal spray technique has been discussed and demonstrated.  Nasal saline spray (i.e., Simply Saline) or nasal saline lavage (i.e., NeilMed) is recommended as needed and prior to medicated nasal sprays.  If allergen avoidance measures and medications fail to  adequately relieve symptoms, aeroallergen immunotherapy will be considered.  Allergic conjunctivitis  Treatment plan as outlined above for allergic rhinitis.  A prescription has been provided for generic Pataday, one drop per eye daily as needed.  If insurance does not cover this medication, medicated allergy eyedrops may be purchased over-the-counter as Art therapist.   Meds ordered this encounter  Medications  . montelukast (SINGULAIR) 10 MG tablet    Sig: Take 1 tablet (10 mg total) by mouth at bedtime.    Dispense:  30 tablet    Refill:  5    This is a courtesy refill. Patient needs an OV for further refills.  Marland Kitchen loratadine (CLARITIN) 10 MG tablet    Sig: Take 1 tablet (10 mg total) by mouth daily.    Dispense:  30 tablet    Refill:  5    This is  . Olopatadine HCl (PATADAY) 0.2 % SOLN    Sig: Place 1 drop into both eyes daily as needed.    Dispense:  2.5 mL    Refill:  5  . fluticasone (FLONASE) 50 MCG/ACT nasal spray    Sig: Place 2 sprays into both nostrils daily as needed.    Dispense:  16 g    Refill:  5  . azelastine (ASTELIN) 0.1 % nasal spray    Sig: Place 1-2 sprays into both nostrils 2 (two) times daily as needed.    Dispense:  30 mL    Refill:  5    Diagnostics: Spirometry:  Normal with an FEV1 of 85% predicted. This study was performed while the patient was asymptomatic.  Please see scanned spirometry results for details.    Physical examination: Blood pressure 108/68, pulse 87,  temperature 98.3 F (36.8 C), temperature source Oral, resp. rate 16, height 5' (1.524 m), weight 169 lb 6.4 oz (76.8 kg), SpO2 100 %.  General: Alert, interactive, in no acute distress. HEENT: TMs pearly gray, turbinates mildly edematous without discharge, post-pharynx mildly erythematous. Neck: Supple without lymphadenopathy. Lungs: Clear to auscultation without wheezing, rhonchi or rales. CV: Normal S1, S2 without murmurs. Skin: Warm and dry, without  lesions or rashes.  The following portions of the patient's history were reviewed and updated as appropriate: allergies, current medications, past family history, past medical history, past social history, past surgical history and problem list.  Current Outpatient Medications  Medication Sig Dispense Refill  . b complex vitamins capsule Take 1 capsule by mouth daily.    . budesonide-formoterol (SYMBICORT) 160-4.5 MCG/ACT inhaler Inhale two puffs twice daily to prevent cough or wheeze. Rinse mouth after use. 1 Inhaler 5  . Cholecalciferol (VITAMIN D3) 125 MCG (5000 UT) TABS Take by mouth.    . fluocinonide (LIDEX) 0.05 % external solution     . fluticasone (FLONASE) 50 MCG/ACT nasal spray Place 2 sprays into both nostrils daily as needed. 16 g 5  . ketoconazole (NIZORAL) 2 % shampoo Apply 1 application topically 2 (two) times a week.    . loratadine (CLARITIN) 10 MG tablet Take 1 tablet (10 mg total) by mouth daily. 30 tablet 5  . montelukast (SINGULAIR) 10 MG tablet Take 1 tablet (10 mg total) by mouth at bedtime. 30 tablet 5  . Prenatal Vit-Fe Fumarate-FA (PRENATAL VITAMIN PO) Take by mouth.    . VENTOLIN HFA 108 (90 Base) MCG/ACT inhaler INHALE 2 PUFFS INTO THE LUNGS EVERY 4 (FOUR) HOURS AS NEEDED FOR WHEEZING OR SHORTNESS OF BREATH. 18 Inhaler 1  . azelastine (ASTELIN) 0.1 % nasal spray Place 1-2 sprays into both nostrils 2 (two) times daily as needed. 30 mL 5  . Olopatadine HCl (PATADAY) 0.2 % SOLN Place 1 drop into both eyes daily as needed. 2.5 mL 5   No current facility-administered medications for this visit.    No Known Allergies  Review of systems: Review of systems negative except as noted in HPI / PMHx.  Past Medical History:  Diagnosis Date  . Allergic rhinitis   . Asthma   . Vitamin D deficiency     Family History  Problem Relation Age of Onset  . Hypertension Father   . Diabetes Father   . Heart disease Father   . Allergic rhinitis Sister   . Asthma Sister   .  Angioedema Neg Hx   . Eczema Neg Hx   . Immunodeficiency Neg Hx   . Urticaria Neg Hx     Social History   Socioeconomic History  . Marital status: Single    Spouse name: Not on file  . Number of children: Not on file  . Years of education: Not on file  . Highest education level: Not on file  Occupational History  . Not on file  Tobacco Use  . Smoking status: Never Smoker  . Smokeless tobacco: Never Used  Vaping Use  . Vaping Use: Never used  Substance and Sexual Activity  . Alcohol use: No  . Drug use: No  . Sexual activity: Not on file  Other Topics Concern  . Not on file  Social History Narrative  . Not on file   Social Determinants of Health   Financial Resource Strain:   . Difficulty of Paying Living Expenses:   Food Insecurity:   .  Worried About Charity fundraiser in the Last Year:   . Arboriculturist in the Last Year:   Transportation Needs:   . Film/video editor (Medical):   Marland Kitchen Lack of Transportation (Non-Medical):   Physical Activity:   . Days of Exercise per Week:   . Minutes of Exercise per Session:   Stress:   . Feeling of Stress :   Social Connections:   . Frequency of Communication with Friends and Family:   . Frequency of Social Gatherings with Friends and Family:   . Attends Religious Services:   . Active Member of Clubs or Organizations:   . Attends Archivist Meetings:   Marland Kitchen Marital Status:   Intimate Partner Violence:   . Fear of Current or Ex-Partner:   . Emotionally Abused:   Marland Kitchen Physically Abused:   . Sexually Abused:     I appreciate the opportunity to take part in Jase's care. Please do not hesitate to contact me with questions.  Sincerely,   R. Edgar Frisk, MD

## 2020-01-31 NOTE — Assessment & Plan Note (Signed)
   Continue montelukast 10 mg daily at bedtime.  Continue albuterol HFA, 1 to 2 inhalations every 4-6 hours if needed.  During upper respiratory tract infections or asthma flares, add Symbicort 160-4.5 g, 2 inhalations via spacer device twice daily until symptoms have returned to baseline.  Subjective and objective measures of pulmonary function will be followed and the treatment plan will be adjusted accordingly. 

## 2020-01-31 NOTE — Assessment & Plan Note (Signed)
   Currently with suboptimal control.  Continue appropriate allergen avoidance measures.  A prescription has been provided for azelastine/fluticasone nasal spray, 1 spray per nostril twice daily as needed. Proper nasal spray technique has been discussed and demonstrated.  Nasal saline spray (i.e., Simply Saline) or nasal saline lavage (i.e., NeilMed) is recommended as needed and prior to medicated nasal sprays.  If allergen avoidance measures and medications fail to adequately relieve symptoms, aeroallergen immunotherapy will be considered.

## 2020-01-31 NOTE — Patient Instructions (Addendum)
Mild persistent asthma  Continue montelukast 10 mg daily at bedtime.  Continue albuterol HFA, 1 to 2 inhalations every 4-6 hours if needed.  During upper respiratory tract infections or asthma flares, add Symbicort 160-4.5 g, 2 inhalations via spacer device twice daily until symptoms have returned to baseline.  Subjective and objective measures of pulmonary function will be followed and the treatment plan will be adjusted accordingly.  Allergic rhinitis  Currently with suboptimal control.  Continue appropriate allergen avoidance measures.  A prescription has been provided for azelastine/fluticasone nasal spray, 1 spray per nostril twice daily as needed. Proper nasal spray technique has been discussed and demonstrated.  Nasal saline spray (i.e., Simply Saline) or nasal saline lavage (i.e., NeilMed) is recommended as needed and prior to medicated nasal sprays.  If allergen avoidance measures and medications fail to adequately relieve symptoms, aeroallergen immunotherapy will be considered.  Allergic conjunctivitis  Treatment plan as outlined above for allergic rhinitis.  A prescription has been provided for generic Pataday, one drop per eye daily as needed.  If insurance does not cover this medication, medicated allergy eyedrops may be purchased over-the-counter as Art therapist.   Return in about 5 months (around 07/02/2020), or if symptoms worsen or fail to improve.

## 2020-02-01 ENCOUNTER — Other Ambulatory Visit: Payer: Self-pay

## 2020-02-01 MED ORDER — FLUTICASONE PROPIONATE 50 MCG/ACT NA SUSP: 2.0000 | Freq: Every day | NASAL | 5 refills | Status: DC | PRN

## 2020-02-01 NOTE — Telephone Encounter (Signed)
Patient was seen by Dr. Nunzio Cobbs yesterday, 01/31/2020 and given refills on all medications. Will close out message thread.

## 2020-02-22 ENCOUNTER — Ambulatory Visit: Payer: 59 | Admitting: Allergy

## 2020-03-05 ENCOUNTER — Other Ambulatory Visit: Payer: Self-pay

## 2020-03-05 ENCOUNTER — Emergency Department (HOSPITAL_BASED_OUTPATIENT_CLINIC_OR_DEPARTMENT_OTHER)
Admission: EM | Admit: 2020-03-05 | Discharge: 2020-03-05 | Disposition: A | Discharge status: Home / Self Care | Payer: 59 | Attending: Emergency Medicine | Admitting: Emergency Medicine

## 2020-03-05 ENCOUNTER — Encounter (HOSPITAL_BASED_OUTPATIENT_CLINIC_OR_DEPARTMENT_OTHER): Payer: Self-pay

## 2020-03-05 DIAGNOSIS — J45909 Unspecified asthma, uncomplicated: Secondary | ICD-10-CM | POA: Diagnosis not present

## 2020-03-05 DIAGNOSIS — R002 Palpitations: Secondary | ICD-10-CM | POA: Insufficient documentation

## 2020-03-05 DIAGNOSIS — T887XXA Unspecified adverse effect of drug or medicament, initial encounter: Secondary | ICD-10-CM | POA: Insufficient documentation

## 2020-03-05 DIAGNOSIS — F419 Anxiety disorder, unspecified: Secondary | ICD-10-CM | POA: Insufficient documentation

## 2020-03-05 DIAGNOSIS — T50995A Adverse effect of other drugs, medicaments and biological substances, initial encounter: Secondary | ICD-10-CM | POA: Insufficient documentation

## 2020-03-05 NOTE — ED Notes (Signed)
ED Provider at bedside. 

## 2020-03-05 NOTE — ED Provider Notes (Signed)
[facility] EMERGENCY DEPARTMENT Provider Note  CSN: [id number] Arrival date & time: 03/05/20 0109  Chief Complaint(s) No chief complaint on file.  HPI Stacey Savage is a 39 y.o. female   CC: Palpitations  Onset/Duration: Sudden approximately 1 to 2 hours ago Timing: Constant Quality: Rapid Severity: Severe Modifying Factors:  Improved by: Nothing  Worsened by: Anxiety Associated Signs/Symptoms:  Pertinent (+): Feeling shaky inside, headache  Pertinent (-): Fevers, chills, nausea, vomiting, abdominal pain, chest pain, shortness of breath Context: Patient was recently seen in urgent care for migrating body aches and prescribed prednisone for questionable polymyalgia rheumatica.  She took 60 mg 2 days ago and 50 mg yesterday.  Patient had labs during that time that were grossly reassuring without leukocytosis or anemia.  No significant electrolyte derangements other than mild hypokalemia.  No renal insufficiency.  TSH and inflammatory markers were within normal limits.  HPI  Past Medical History Past Medical History:  Diagnosis Date  . Allergic rhinitis   . Asthma   . Vitamin D deficiency    Patient Active Problem List   Diagnosis Date Noted  . Mild persistent asthma 01/31/2020  . Allergic rhinitis 01/31/2020  . Allergic conjunctivitis 01/31/2020   Home Medication(s) Prior to Admission medications   Medication Sig Start Date End Date Taking? Authorizing Provider  azelastine (ASTELIN) 0.1 % nasal spray Place 1-2 sprays into both nostrils 2 (two) times daily as needed. 01/31/20   Bobbitt, Heywood Iles, MD  b complex vitamins capsule Take 1 capsule by mouth daily.    [provider]  budesonide-formoterol (SYMBICORT) 160-4.5 MCG/ACT inhaler Inhale two puffs twice daily to prevent cough or wheeze. Rinse mouth after use. 12/21/18   Marcelyn Bruins, MD  Cholecalciferol (VITAMIN D3) 125 MCG (5000 UT) TABS Take by mouth.    [provider]    fluocinonide (LIDEX) 0.05 % external solution  01/15/20   [provider]  fluticasone (FLONASE) 50 MCG/ACT nasal spray Place 2 sprays into both nostrils daily as needed. 02/01/20   Bobbitt, Heywood Iles, MD  ketoconazole (NIZORAL) 2 % shampoo Apply 1 application topically 2 (two) times a week. 01/15/20   [provider]  loratadine (CLARITIN) 10 MG tablet Take 1 tablet (10 mg total) by mouth daily. 01/31/20   Bobbitt, Heywood Iles, MD  montelukast (SINGULAIR) 10 MG tablet Take 1 tablet (10 mg total) by mouth at bedtime. 01/31/20   Bobbitt, Heywood Iles, MD  Olopatadine HCl (PATADAY) 0.2 % SOLN Place 1 drop into both eyes daily as needed. 01/31/20   Bobbitt, Heywood Iles, MD  Prenatal Vit-Fe Fumarate-FA (PRENATAL VITAMIN PO) Take by mouth.    [provider]  VENTOLIN HFA 108 (90 Base) MCG/ACT inhaler INHALE 2 PUFFS INTO THE LUNGS EVERY 4 (FOUR) HOURS AS NEEDED FOR WHEEZING OR SHORTNESS OF BREATH. 01/22/19   Kozlow, Alvira Philips, MD  Past Surgical History History reviewed. No pertinent surgical history. Family History Family History  Problem Relation Age of Onset  . Hypertension Father   . Diabetes Father   . Heart disease Father   . Allergic rhinitis Sister   . Asthma Sister   . Angioedema Neg Hx   . Eczema Neg Hx   . Immunodeficiency Neg Hx   . Urticaria Neg Hx     Social History Social History   Tobacco Use  . Smoking status: Never Smoker  . Smokeless tobacco: Never Used  Vaping Use  . Vaping Use: Never used  Substance Use Topics  . Alcohol use: No  . Drug use: No   Allergies Patient has no known allergies.  Review of Systems Review of Systems All other systems are reviewed and are negative for acute change except as noted in the HPI  Physical Exam Vital Signs  I have reviewed the triage vital signs BP (!) 136/91 (BP  Location: Right Arm)   Pulse 81   Temp 98.1 F (36.7 C) (Oral)   Resp 15   Ht 5' (1.524 m)   Wt 77.1 kg   LMP 03/04/2020 Comment: breast feeding  SpO2 100%   BMI 33.20 kg/m   Physical Exam  ED Results and Treatments Labs (all labs ordered are listed, but only abnormal results are displayed) Labs Reviewed - No data to display                                                                                                                       EKG  EKG Interpretation  Date/Time:    Ventricular Rate:    PR Interval:    QRS Duration:   QT Interval:    QTC Calculation:   R Axis:     Text Interpretation:        Radiology No results found.  Pertinent labs & imaging results that were available during my care of the patient were reviewed by me and considered in my medical decision making (see chart for details).  Medications Ordered in ED Medications - No data to display                                                                                                                                  Procedures Procedures  (including critical care time)  Medical Decision Making / ED Course I have reviewed the nursing notes for this  encounter and the patient's prior records (if available in EHR or on provided paperwork).   Stacey Savage was evaluated in Emergency Department on 03/05/2020 for the symptoms described in the history of present illness. She was evaluated in the context of the global COVID-19 pandemic, which necessitated consideration that the patient might be at risk for infection with the SARS-CoV-2 virus that causes COVID-19. Institutional protocols and algorithms that pertain to the evaluation of patients at risk for COVID-19 are in a state of rapid change based on information released by regulatory bodies including the CDC and federal and state organizations. These policies and algorithms were followed during the patient's care in the ED.  Patient reports still  feeling the palpitations and rapid heartbeat. Pulses are approximately 80 during this time and regular. Lung clear.  Able to reduce symptoms with therapeutic breathing and meditation. She likely is having side effects from prednisone with superimposed anxiety.    Final Clinical Impression(s) / ED Diagnoses Final diagnoses:  Medication side effect  Anxiety   The patient appears reasonably screened and/or stabilized for discharge and I doubt any other medical condition or other [facility] requiring further screening, evaluation, or treatment in the ED at this time prior to discharge. Safe for discharge with strict return precautions.  Disposition: Discharge  Condition: Good  I have discussed the results, Dx and Tx plan with the patient/family who expressed understanding and agree(s) with the plan. Discharge instructions discussed at length. The patient/family was given strict return precautions who verbalized understanding of the instructions. No further questions at time of discharge.    ED Discharge Orders    None         Follow Up: Gillis Santa, MD [address] [place] [address] [phone]  Schedule an appointment as soon as possible for a visit  As needed      This chart was dictated using voice recognition software.  Despite best efforts to proofread,  errors can occur which can change the documentation meaning.   Nira Conn, MD 03/05/20 [phone]

## 2020-03-05 NOTE — ED Triage Notes (Signed)
Aches & pains x2 weeks, was seen at [facility]/has had virtual visits. Took Flonase & prednisone as prescribed, PCP appt on Thursday, woke up today heart racing. NAD. Anxious in triage.

## 2020-03-27 ENCOUNTER — Emergency Department (HOSPITAL_BASED_OUTPATIENT_CLINIC_OR_DEPARTMENT_OTHER)
Admission: EM | Admit: 2020-03-27 | Discharge: 2020-03-27 | Disposition: A | Discharge status: Home / Self Care | Payer: 59 | Attending: Emergency Medicine | Admitting: Emergency Medicine

## 2020-03-27 ENCOUNTER — Other Ambulatory Visit: Payer: Self-pay

## 2020-03-27 ENCOUNTER — Encounter (HOSPITAL_BASED_OUTPATIENT_CLINIC_OR_DEPARTMENT_OTHER): Payer: Self-pay | Admitting: Emergency Medicine

## 2020-03-27 ENCOUNTER — Emergency Department (HOSPITAL_BASED_OUTPATIENT_CLINIC_OR_DEPARTMENT_OTHER): Payer: 59

## 2020-03-27 DIAGNOSIS — Z20822 Contact with and (suspected) exposure to covid-19: Secondary | ICD-10-CM | POA: Insufficient documentation

## 2020-03-27 DIAGNOSIS — R202 Paresthesia of skin: Secondary | ICD-10-CM | POA: Diagnosis not present

## 2020-03-27 DIAGNOSIS — R200 Anesthesia of skin: Secondary | ICD-10-CM | POA: Insufficient documentation

## 2020-03-27 DIAGNOSIS — M79602 Pain in left arm: Secondary | ICD-10-CM | POA: Diagnosis not present

## 2020-03-27 LAB — URINALYSIS, ROUTINE W REFLEX MICROSCOPIC
Bilirubin Urine: NEGATIVE
Glucose, UA: NEGATIVE mg/dL
Hgb urine dipstick: NEGATIVE
Ketones, ur: 15 mg/dL — AB
Leukocytes,Ua: NEGATIVE
Nitrite: NEGATIVE
Protein, ur: NEGATIVE mg/dL
Specific Gravity, Urine: 1.015 (ref 1.005–1.030)
pH: 6.5 (ref 5.0–8.0)

## 2020-03-27 LAB — CBC WITH DIFFERENTIAL/PLATELET
Abs Immature Granulocytes: 0.03 10*3/uL (ref 0.00–0.07)
Basophils Absolute: 0 10*3/uL (ref 0.0–0.1)
Basophils Relative: 0 %
Eosinophils Absolute: 0.1 10*3/uL (ref 0.0–0.5)
Eosinophils Relative: 1 %
HCT: 38.5 % (ref 36.0–46.0)
Hemoglobin: 12.4 g/dL (ref 12.0–15.0)
Immature Granulocytes: 0 %
Lymphocytes Relative: 36 %
Lymphs Abs: 3.2 10*3/uL (ref 0.7–4.0)
MCH: 28 pg (ref 26.0–34.0)
MCHC: 32.2 g/dL (ref 30.0–36.0)
MCV: 86.9 fL (ref 80.0–100.0)
Monocytes Absolute: 0.6 10*3/uL (ref 0.1–1.0)
Monocytes Relative: 6 %
Neutro Abs: 5 10*3/uL (ref 1.7–7.7)
Neutrophils Relative %: 57 %
Platelets: 276 10*3/uL (ref 150–400)
RBC: 4.43 MIL/uL (ref 3.87–5.11)
RDW: 13.3 % (ref 11.5–15.5)
WBC: 8.9 10*3/uL (ref 4.0–10.5)
nRBC: 0 % (ref 0.0–0.2)

## 2020-03-27 LAB — BASIC METABOLIC PANEL
Anion gap: 10 (ref 5–15)
BUN: 5 mg/dL — ABNORMAL LOW (ref 6–20)
CO2: 22 mmol/L (ref 22–32)
Calcium: 8.7 mg/dL — ABNORMAL LOW (ref 8.9–10.3)
Chloride: 105 mmol/L (ref 98–111)
Creatinine, Ser: 0.55 mg/dL (ref 0.44–1.00)
GFR calc Af Amer: >60 mL/min (ref >60)
GFR calc non Af Amer: >60 mL/min (ref >60)
Glucose, Bld: 96 mg/dL (ref 70–99)
Potassium: 3.2 mmol/L — ABNORMAL LOW (ref 3.5–5.1)
Sodium: 137 mmol/L (ref 135–145)

## 2020-03-27 LAB — PREGNANCY, URINE: Preg Test, Ur: NEGATIVE

## 2020-03-27 LAB — SARS CORONAVIRUS 2 BY RT PCR (HOSPITAL ORDER, PERFORMED IN ~~LOC~~ HOSPITAL LAB): SARS Coronavirus 2: NEGATIVE

## 2020-03-27 NOTE — Discharge Instructions (Signed)
Work-up today to include CBC basic metabolic panel head CT urinalysis Covid test and pregnancy test all without significant findings.  Recommend follow back up with your primary care doctor particularly with the positive Epstein-Barr virus titers that were done on Sunday.  Return for any new or worse symptoms.  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

## 2020-03-27 NOTE — ED Provider Notes (Signed)
[facility] EMERGENCY DEPARTMENT Provider Note   CSN: [id number] Arrival date & time: 03/27/20  1558     History Chief Complaint  Patient presents with  . Fatigue  . Arm Pain    Stacey Savage is a 39 y.o. female.  Patient has not been feeling well for the last couple months.  Darted feeling bad when her daughter had what was thought to be a viral illness.  She was seen by her primary care doctor on Sunday and had extensive labs done to include normal thyroid.  Epstein-Barr virus titers were elevated however.  Today patient had some left arm tingling and numbness that kind of started while at work.  Has now resolved.  Never had any weakness or speech or vision problems.  Patient's also had nausea fatigue and decreased appetite and some body aches for the past couple months.  In addition patient's had both Covid vaccines in the spring.  Has had negative Covid test in the past.  Has also had some headaches with this over the past 2 months.        Past Medical History:  Diagnosis Date  . Allergic rhinitis   . Asthma   . Vitamin D deficiency     Patient Active Problem List   Diagnosis Date Noted  . Mild persistent asthma 01/31/2020  . Allergic rhinitis 01/31/2020  . Allergic conjunctivitis 01/31/2020    History reviewed. No pertinent surgical history.   OB History   No obstetric history on file.     Family History  Problem Relation Age of Onset  . Hypertension Father   . Diabetes Father   . Heart disease Father   . Allergic rhinitis Sister   . Asthma Sister   . Angioedema Neg Hx   . Eczema Neg Hx   . Immunodeficiency Neg Hx   . Urticaria Neg Hx     Social History   Tobacco Use  . Smoking status: Never Smoker  . Smokeless tobacco: Never Used  Vaping Use  . Vaping Use: Never used  Substance Use Topics  . Alcohol use: No  . Drug use: No    Home Medications Prior to Admission medications   Medication Sig Start Date End Date Taking?  Authorizing Provider  azelastine (ASTELIN) 0.1 % nasal spray Place 1-2 sprays into both nostrils 2 (two) times daily as needed. 01/31/20   Bobbitt, Heywood Iles, MD  b complex vitamins capsule Take 1 capsule by mouth daily.    [provider]  budesonide-formoterol (SYMBICORT) 160-4.5 MCG/ACT inhaler Inhale two puffs twice daily to prevent cough or wheeze. Rinse mouth after use. 12/21/18   Marcelyn Bruins, MD  Cholecalciferol (VITAMIN D3) 125 MCG (5000 UT) TABS Take by mouth.    [provider]  fluocinonide (LIDEX) 0.05 % external solution  01/15/20   [provider]  fluticasone (FLONASE) 50 MCG/ACT nasal spray Place 2 sprays into both nostrils daily as needed. 02/01/20   Bobbitt, Heywood Iles, MD  ketoconazole (NIZORAL) 2 % shampoo Apply 1 application topically 2 (two) times a week. 01/15/20   [provider]  loratadine (CLARITIN) 10 MG tablet Take 1 tablet (10 mg total) by mouth daily. 01/31/20   Bobbitt, Heywood Iles, MD  montelukast (SINGULAIR) 10 MG tablet Take 1 tablet (10 mg total) by mouth at bedtime. 01/31/20   Bobbitt, Heywood Iles, MD  Olopatadine HCl (PATADAY) 0.2 % SOLN Place 1 drop into both eyes daily as needed. 01/31/20   Bobbitt, Heywood Iles,  MD  Prenatal Vit-Fe Fumarate-FA (PRENATAL VITAMIN PO) Take by mouth.    [provider]  VENTOLIN HFA 108 (90 Base) MCG/ACT inhaler INHALE 2 PUFFS INTO THE LUNGS EVERY 4 (FOUR) HOURS AS NEEDED FOR WHEEZING OR SHORTNESS OF BREATH. 01/22/19   Kozlow, Alvira Philips, MD    Allergies    Patient has no known allergies.  Review of Systems   Review of Systems  Constitutional: Positive for appetite change and fatigue. Negative for chills and fever.  HENT: Negative for congestion, rhinorrhea and sore throat.   Eyes: Negative for visual disturbance.  Respiratory: Negative for cough and shortness of breath.   Cardiovascular: Negative for chest pain and leg swelling.  Gastrointestinal: Positive for nausea.  Negative for abdominal pain, diarrhea and vomiting.  Genitourinary: Negative for dysuria.  Musculoskeletal: Positive for back pain and myalgias. Negative for neck pain.  Skin: Negative for rash.  Neurological: Positive for numbness and headaches. Negative for dizziness, facial asymmetry, speech difficulty, weakness and light-headedness.  Hematological: Does not bruise/bleed easily.  Psychiatric/Behavioral: Negative for confusion.    Physical Exam Updated Vital Signs BP 128/79   Pulse 78   Temp 98.2 F (36.8 C) (Oral)   Resp 16   Ht 1.524 m (5')   Wt 73 kg   LMP 03/04/2020 Comment: breast feeding  SpO2 100%   BMI 31.44 kg/m   Physical Exam Vitals and nursing note reviewed.  Constitutional:      General: She is not in acute distress.    Appearance: Normal appearance. She is well-developed.  HENT:     Head: Normocephalic and atraumatic.  Eyes:     Extraocular Movements: Extraocular movements intact.     Conjunctiva/sclera: Conjunctivae normal.     Pupils: Pupils are equal, round, and reactive to light.  Cardiovascular:     Rate and Rhythm: Normal rate and regular rhythm.     Heart sounds: No murmur heard.   Pulmonary:     Effort: Pulmonary effort is normal. No respiratory distress.     Breath sounds: Normal breath sounds.  Abdominal:     Palpations: Abdomen is soft.     Tenderness: There is no abdominal tenderness.  Musculoskeletal:     Cervical back: Normal range of motion and neck supple.  Skin:    General: Skin is warm and dry.     Capillary Refill: Capillary refill takes less than 2 seconds.  Neurological:     General: No focal deficit present.     Mental Status: She is alert and oriented to person, place, and time.     Cranial Nerves: No cranial nerve deficit.     Sensory: No sensory deficit.     Motor: No weakness.     ED Results / Procedures / Treatments   Labs (all labs ordered are listed, but only abnormal results are displayed) Labs Reviewed - No  data to display  EKG None  Radiology No results found.  Procedures Procedures (including critical care time)  Medications Ordered in ED Medications - No data to display  ED Course  I have reviewed the triage vital signs and the nursing notes.  Pertinent labs & imaging results that were available during my care of the patient were reviewed by me and considered in my medical decision making (see chart for details).    MDM Rules/Calculators/A&P                          Exam here  without any significant findings.  Vital signs oxygen saturation 100%.  No fever not tachycardic blood pressure 128/79.  Lab work-up CBC basic metabolic panel urinalysis pregnancy test and Covid test all negative.  CT head also without any acute findings.  Patient's lab review done on Sunday did show positive Epstein-Barr virus titers.  Has follow-up with her primary to go over this.  Would also consider may be MRI brain.  There is been mention that may be there is a migraine equivalent.  But the EBV test may explain a lot since her daughter was sick at one point in time and she has not felt well since then.  No significant neuro findings here tonight.  Patient stable for discharge home and follow-up with her doctor    Final Clinical Impression(s) / ED Diagnoses Final diagnoses:  None    Rx / DC Orders ED Discharge Orders    None       Vanetta Mulders, MD 03/27/20 2302

## 2020-03-27 NOTE — ED Triage Notes (Addendum)
HAS FELT BAD X 2 MONTHS AND WAS  Seen at [facility]   Last Sunday  Had  cray and labs done , today she cont to feel bad and she went to lunch and she states that her left arm felt numb and cool to her touch , aching whole left  arm  Tingling also  She  States  Has nausea no vomiting and some sob but states she has asthma, no cp , FAST is neg,  Denies any injury states was , has equal grip no neglect no facial dropp of slurring of speech

## 2020-03-27 NOTE — ED Notes (Signed)
ED Provider at bedside. 

## 2020-03-27 NOTE — ED Notes (Signed)
Patient transported to CT 

## 2020-03-27 NOTE — ED Notes (Signed)
Pt ambulated to the bathroom without difficulty.  

## 2020-04-04 ENCOUNTER — Emergency Department (HOSPITAL_BASED_OUTPATIENT_CLINIC_OR_DEPARTMENT_OTHER): Payer: 59

## 2020-04-04 ENCOUNTER — Other Ambulatory Visit: Payer: Self-pay

## 2020-04-04 ENCOUNTER — Encounter (HOSPITAL_BASED_OUTPATIENT_CLINIC_OR_DEPARTMENT_OTHER): Payer: Self-pay

## 2020-04-04 ENCOUNTER — Emergency Department (HOSPITAL_BASED_OUTPATIENT_CLINIC_OR_DEPARTMENT_OTHER)
Admission: EM | Admit: 2020-04-04 | Discharge: 2020-04-05 | Disposition: A | Discharge status: Home / Self Care | Payer: 59 | Attending: Emergency Medicine | Admitting: Emergency Medicine

## 2020-04-04 DIAGNOSIS — J45909 Unspecified asthma, uncomplicated: Secondary | ICD-10-CM | POA: Insufficient documentation

## 2020-04-04 DIAGNOSIS — R110 Nausea: Secondary | ICD-10-CM | POA: Diagnosis not present

## 2020-04-04 DIAGNOSIS — R079 Chest pain, unspecified: Secondary | ICD-10-CM | POA: Diagnosis present

## 2020-04-04 DIAGNOSIS — M791 Myalgia, unspecified site: Secondary | ICD-10-CM

## 2020-04-04 DIAGNOSIS — B259 Cytomegaloviral disease, unspecified: Secondary | ICD-10-CM | POA: Diagnosis not present

## 2020-04-04 DIAGNOSIS — R197 Diarrhea, unspecified: Secondary | ICD-10-CM | POA: Insufficient documentation

## 2020-04-04 HISTORY — DX: Anxiety disorder, unspecified: F41.9

## 2020-04-04 LAB — BASIC METABOLIC PANEL
Anion gap: 12 (ref 5–15)
BUN: 6 mg/dL (ref 6–20)
CO2: 24 mmol/L (ref 22–32)
Calcium: 9.1 mg/dL (ref 8.9–10.3)
Chloride: 103 mmol/L (ref 98–111)
Creatinine, Ser: 0.53 mg/dL (ref 0.44–1.00)
GFR calc Af Amer: >60 mL/min (ref >60)
GFR calc non Af Amer: >60 mL/min (ref >60)
Glucose, Bld: 99 mg/dL (ref 70–99)
Potassium: 3.8 mmol/L (ref 3.5–5.1)
Sodium: 139 mmol/L (ref 135–145)

## 2020-04-04 LAB — CBC
HCT: 42.5 % (ref 36.0–46.0)
Hemoglobin: 13.4 g/dL (ref 12.0–15.0)
MCH: 28 pg (ref 26.0–34.0)
MCHC: 31.5 g/dL (ref 30.0–36.0)
MCV: 88.7 fL (ref 80.0–100.0)
Platelets: 295 10*3/uL (ref 150–400)
RBC: 4.79 MIL/uL (ref 3.87–5.11)
RDW: 13.1 % (ref 11.5–15.5)
WBC: 8.8 10*3/uL (ref 4.0–10.5)
nRBC: 0 % (ref 0.0–0.2)

## 2020-04-04 LAB — PREGNANCY, URINE: Preg Test, Ur: NEGATIVE

## 2020-04-04 LAB — TROPONIN I (HIGH SENSITIVITY): Troponin I (High Sensitivity): <2 ng/L (ref <18)

## 2020-04-04 MED ORDER — ONDANSETRON HCL 4 MG/2ML IJ SOLN
4.0000 mg | Freq: Once | INTRAMUSCULAR | Status: AC
  Administered 2020-04-05: 4 mg via INTRAVENOUS
  Filled 2020-04-04: qty 2

## 2020-04-04 MED ORDER — LACTATED RINGERS IV BOLUS
1000.0000 mL | Freq: Once | INTRAVENOUS | Status: AC
  Administered 2020-04-05: 1000 mL via INTRAVENOUS

## 2020-04-04 NOTE — ED Triage Notes (Addendum)
Pt c/o CP x today-states she was recently seen for same-states she has not been feeling well x 1 month/weight loss ~10lbs-NAD-steady gait

## 2020-04-04 NOTE — ED Provider Notes (Signed)
[facility] EMERGENCY DEPARTMENT Provider Note   CSN: [id number] Arrival date & time: 04/04/20  2132    History Chief Complaint  Patient presents with  . Chest Pain    Stacey Savage is a 39 y.o. female.  The history is provided by the patient.  Chest Pain She has a history of asthma and comes in because of chest pains today.  She has been sick for about the last month.  During this time, she has had some generalized body aches, nausea, weight loss.  She was started on Zoloft 3 days ago and started having diarrhea following that.  She is nausea had predated starting on Zoloft.  She had extensive work-up by her primary care provider with no clear cause found.  She had been started on a course of prednisone, but had a severe reaction to that.  Today, she had some pain in the left side of her chest beneath her rib cage and some tight feeling in her chest.  She did use her inhaler with little relief.  She is a non-smoker and denies history of hypertension, diabetes, hyperlipidemia.  Past Medical History:  Diagnosis Date  . Allergic rhinitis   . Anxiety   . Asthma   . Vitamin D deficiency     Patient Active Problem List   Diagnosis Date Noted  . Mild persistent asthma 01/31/2020  . Allergic rhinitis 01/31/2020  . Allergic conjunctivitis 01/31/2020    History reviewed. No pertinent surgical history.   OB History   No obstetric history on file.     Family History  Problem Relation Age of Onset  . Hypertension Father   . Diabetes Father   . Heart disease Father   . Allergic rhinitis Sister   . Asthma Sister   . Angioedema Neg Hx   . Eczema Neg Hx   . Immunodeficiency Neg Hx   . Urticaria Neg Hx     Social History   Tobacco Use  . Smoking status: Never Smoker  . Smokeless tobacco: Never Used  Vaping Use  . Vaping Use: Never used  Substance Use Topics  . Alcohol use: No  . Drug use: No    Home Medications Prior to Admission medications    Medication Sig Start Date End Date Taking? Authorizing Provider  azelastine (ASTELIN) 0.1 % nasal spray Place 1-2 sprays into both nostrils 2 (two) times daily as needed. 01/31/20   Bobbitt, Heywood Iles, MD  b complex vitamins capsule Take 1 capsule by mouth daily.    [provider]  budesonide-formoterol (SYMBICORT) 160-4.5 MCG/ACT inhaler Inhale two puffs twice daily to prevent cough or wheeze. Rinse mouth after use. 12/21/18   Marcelyn Bruins, MD  Cholecalciferol (VITAMIN D3) 125 MCG (5000 UT) TABS Take by mouth.    [provider]  fluocinonide (LIDEX) 0.05 % external solution  01/15/20   [provider]  fluticasone (FLONASE) 50 MCG/ACT nasal spray Place 2 sprays into both nostrils daily as needed. 02/01/20   Bobbitt, Heywood Iles, MD  ketoconazole (NIZORAL) 2 % shampoo Apply 1 application topically 2 (two) times a week. 01/15/20   [provider]  loratadine (CLARITIN) 10 MG tablet Take 1 tablet (10 mg total) by mouth daily. 01/31/20   Bobbitt, Heywood Iles, MD  montelukast (SINGULAIR) 10 MG tablet Take 1 tablet (10 mg total) by mouth at bedtime. 01/31/20   Bobbitt, Heywood Iles, MD  Olopatadine HCl (PATADAY) 0.2 % SOLN Place 1 drop into both eyes daily as  needed. 01/31/20   Bobbitt, Heywood Iles, MD  Prenatal Vit-Fe Fumarate-FA (PRENATAL VITAMIN PO) Take by mouth.    [provider]  VENTOLIN HFA 108 (90 Base) MCG/ACT inhaler INHALE 2 PUFFS INTO THE LUNGS EVERY 4 (FOUR) HOURS AS NEEDED FOR WHEEZING OR SHORTNESS OF BREATH. 01/22/19   Kozlow, Alvira Philips, MD    Allergies    Patient has no known allergies.  Review of Systems   Review of Systems  Cardiovascular: Positive for chest pain.  All other systems reviewed and are negative.   Physical Exam Updated Vital Signs BP 124/76 (BP Location: Right Arm)   Pulse 75   Temp 98.5 F (36.9 C) (Oral)   Resp 20   Ht 5' (1.524 m)   Wt 71.2 kg   LMP 03/29/2020   SpO2 96%   BMI 30.66 kg/m    Physical Exam Vitals and nursing note reviewed.   39 year old female, resting comfortably and in no acute distress. Vital signs are normal. Oxygen saturation is 96%, which is normal. Head is normocephalic and atraumatic. PERRLA, EOMI. Oropharynx is clear. Neck is nontender and supple without adenopathy or JVD. Back is nontender and there is no CVA tenderness. Lungs are clear without rales, wheezes, or rhonchi. Chest is nontender. Heart has regular rate and rhythm without murmur. Abdomen is soft, flat, nontender without masses or hepatosplenomegaly and peristalsis is hypoactive. Extremities have no cyanosis or edema, full range of motion is present. Skin is warm and dry without rash. Neurologic: Mental status is normal, cranial nerves are intact, there are no motor or sensory deficits.  ED Results / Procedures / Treatments   Labs (all labs ordered are listed, but only abnormal results are displayed) Labs Reviewed  BASIC METABOLIC PANEL  CBC  PREGNANCY, URINE  TROPONIN I (HIGH SENSITIVITY)  TROPONIN I (HIGH SENSITIVITY)    EKG EKG Interpretation  Date/Time:  Friday April 04 2020 21:38:22 EDT Ventricular Rate:  75 PR Interval:  140 QRS Duration: 78 QT Interval:  398 QTC Calculation: 444 R Axis:   60 Text Interpretation: Normal sinus rhythm Cannot rule out Anterior infarct , age undetermined Abnormal ECG When compared with ECG of 03/27/2020, No significant change was found Confirmed by Dione Booze ([id number]) on 04/04/2020 11:43:18 PM   Radiology DG Chest 2 View  Result Date: 04/04/2020 CLINICAL DATA:  Chest pain, left rib pain EXAM: CHEST - 2 VIEW COMPARISON:  03/23/2020 FINDINGS: Calcified granuloma at the right lung base. Lungs otherwise clear. Heart is normal size. No effusions or acute bony abnormality. IMPRESSION: No active cardiopulmonary disease. Electronically Signed   By: Charlett Nose M.D.   On: 04/04/2020 22:11    Procedures Procedures   Medications Ordered in  ED Medications  lactated ringers bolus 1,000 mL (1,000 mLs Intravenous New Bag/Given 04/05/20 0040)  ondansetron (ZOFRAN) injection 4 mg (4 mg Intravenous Given 04/05/20 0043)  hydrOXYzine (ATARAX/VISTARIL) tablet 25 mg (25 mg Oral Given 04/05/20 0043)  loperamide (IMODIUM) capsule 4 mg (4 mg Oral Given 04/05/20 0042)    ED Course  I have reviewed the triage vital signs and the nursing notes.  Pertinent labs & imaging results that were available during my care of the patient were reviewed by me and considered in my medical decision making (see chart for details).  MDM Rules/Calculators/A&P Subacute illness with multiple manifestations.  ECG today is normal as is chest x-ray and CBC and metabolic panel and troponin.  Old records are reviewed, and she had been started on  a course of prednisone a month ago and had side effects from that.  She was also seen in the ED last week for numbness and tingling.  Outpatient work-up has included negative test for Lyme disease, RPR, HIV, that she has/Anaplasma, parvovirus, COVID-19, antinuclear antibodies, RA factor.  Epstein-Barr virus profile suggests prior infection with EBV but not in acute infection.  CMV antibodies to suggest recent infection, and I suspect that that is the cause of her symptoms.  Treatment of CMV is entirely symptomatic.  She is given IV fluids, ondansetron, oral loperamide and oral hydroxyzine in the ED and is discharged with prescriptions for ondansetron and hydroxyzine and she is referred back to her primary care provider.  Final Clinical Impression(s) / ED Diagnoses Final diagnoses:  CMV infection, acute (HCC)  Nausea  Diarrhea, unspecified type  Myalgia    Rx / DC Orders ED Discharge Orders         Ordered    ondansetron (ZOFRAN-ODT) 4 MG disintegrating tablet  Every 8 hours PRN     Discontinue  Reprint     04/05/20 0013    hydrOXYzine (ATARAX/VISTARIL) 25 MG tablet  Every 6 hours PRN     Discontinue  Reprint     04/05/20 0013            Dione Booze, MD 04/05/20 [phone]

## 2020-04-04 NOTE — ED Notes (Signed)
Rec Covid Vaccines x 2, last vaccine was April 2021

## 2020-04-04 NOTE — Discharge Instructions (Addendum)
Stop taking Zoloft - it might be causing your diarrhea. You can try restarting it in 2-3 weeks to see if diarrhea returns.  Drink plenty of fluids.  Take acetaminophen as needed for aching or fever  Take loperamide (Imodium AD) as needed for diarrhea.

## 2020-04-04 NOTE — ED Notes (Signed)
Presents with chest pain, left side, "under my rib" , onset was approx 1 month ago, intermittent, onset can occur with activity or rest, has poor appetite, attempted to eat a banana today and become nauseated, recently started on Zoloft. Often has tightness in chest and took inhaler for her asthma, but relief was very minimal. Currently on cont cardiac monitoring; with int NBP assessments and cont POX monitoring

## 2020-04-05 LAB — TROPONIN I (HIGH SENSITIVITY): Troponin I (High Sensitivity): <2 ng/L (ref <18)

## 2020-04-05 MED ORDER — ONDANSETRON 4 MG PO TBDP: 4.0000 mg | ORAL_TABLET | Freq: Three times a day (TID) | ORAL | 0 refills | Status: DC | PRN

## 2020-04-05 MED ORDER — LOPERAMIDE HCL 2 MG PO CAPS
4.0000 mg | ORAL_CAPSULE | Freq: Once | ORAL | Status: AC
  Administered 2020-04-05: 4 mg via ORAL
  Filled 2020-04-05: qty 2

## 2020-04-05 MED ORDER — HYDROXYZINE HCL 25 MG PO TABS
25.0000 mg | ORAL_TABLET | Freq: Four times a day (QID) | ORAL | 0 refills | Status: DC | PRN
Start: 2020-04-05 — End: 2021-03-12

## 2020-04-05 MED ORDER — HYDROXYZINE HCL 25 MG PO TABS
25.0000 mg | ORAL_TABLET | Freq: Once | ORAL | Status: AC
  Administered 2020-04-05: 25 mg via ORAL
  Filled 2020-04-05: qty 1

## 2020-04-05 NOTE — ED Notes (Signed)
Resting quietly, occ closing eyes, states she feels much better. IVF cont to infuse as ordered

## 2020-04-22 ENCOUNTER — Other Ambulatory Visit: Payer: Self-pay

## 2020-04-22 ENCOUNTER — Ambulatory Visit: Payer: 59 | Admitting: Allergy and Immunology

## 2020-04-22 ENCOUNTER — Encounter: Payer: Self-pay | Admitting: Allergy and Immunology

## 2020-04-22 VITALS — BP 136/76 | HR 108 | Temp 99.1°F | Resp 16 | Ht 60.0 in | Wt 169.0 lb

## 2020-04-22 DIAGNOSIS — J45901 Unspecified asthma with (acute) exacerbation: Secondary | ICD-10-CM | POA: Diagnosis not present

## 2020-04-22 DIAGNOSIS — H1013 Acute atopic conjunctivitis, bilateral: Secondary | ICD-10-CM

## 2020-04-22 DIAGNOSIS — J3089 Other allergic rhinitis: Secondary | ICD-10-CM | POA: Diagnosis not present

## 2020-04-22 MED ORDER — BUDESONIDE-FORMOTEROL FUMARATE 160-4.5 MCG/ACT IN AERO: INHALATION_SPRAY | RESPIRATORY_TRACT | 5 refills | Status: DC

## 2020-04-22 MED ORDER — IPRATROPIUM BROMIDE 0.06 % NA SOLN
2.0000 | Freq: Three times a day (TID) | NASAL | 5 refills | Status: DC | PRN
Start: 2020-04-22 — End: 2020-07-03

## 2020-04-22 MED ORDER — MONTELUKAST SODIUM 10 MG PO TABS: 10.0000 mg | ORAL_TABLET | Freq: Every day | ORAL | 5 refills | Status: DC

## 2020-04-22 NOTE — Progress Notes (Signed)
Follow-up Note  RE: Stacey Savage MRN: [id number] DOB: 17-Sep-1980 Date of Office Visit: 04/22/2020  Primary care provider: Mendel Ryder, PA-C Referring provider: Mendel Ryder, PA-C  History of present illness: Stacey Savage is a 39 y.o. female with persistent asthma and allergic rhinitis presenting today for sick visit.  She was last seen in this clinic on January 31, 2020.  She reports that over the past 5 days she has been experiencing a dry cough, chest tightness, and chest pain.  She denies fevers and chills.  She is uncertain of the trigger.  She admits that she stopped montelukast because she has been in and out of urgent care over the past month because of myalgias and arthralgias.  At one point, approximately a month ago she went to the emergency department because of shortness of breath which was attributed to anxiety at that time.  She has also been experiencing recent nasal congestion, postnasal drainage, and occasional sinus pressure over the forehead.  Assessment and plan: Asthma with acute exacerbation  I have recommended that the patient be Covid tested.  If Covid test is negative, start prednisone, 20 mg x 4 days, 10 mg x1 day, then stop.  For now, and during all respiratory tract infections and/or asthma flares, add Symbicort 160-4.5 g, 2 inhalations via spacer device twice daily, until symptoms have returned to baseline.  Restart montelukast 10 mg daily.  Albuterol every 4-6 hours if needed.  The patient has been asked to contact me if her symptoms persist or progress. Otherwise, she may return for follow up in 4 months.  Allergic rhinitis  Continue appropriate allergen avoidance measures.  Fluticasone nasal spray, 2 spray per nostril daily as needed.  A prescription has been provided for ipratropium nasal spray 0.06%, 1 to 2 sprays per nostril 2-3 times daily as needed.  Nasal saline spray (i.e., Simply Saline) or nasal saline lavage (i.e., NeilMed) is  recommended as needed and prior to medicated nasal sprays.  If allergen avoidance measures and medications fail to adequately relieve symptoms, aeroallergen immunotherapy will be considered.   Meds ordered this encounter  Medications  . ipratropium (ATROVENT) 0.06 % nasal spray    Sig: Place 2 sprays into both nostrils 3 (three) times daily as needed.    Dispense:  15 mL    Refill:  5  . budesonide-formoterol (SYMBICORT) 160-4.5 MCG/ACT inhaler    Sig: 2 puffs twice daily with spacer to prevent coughing or wheezing. Rinse,gargle and spit after use.    Dispense:  1 each    Refill:  5  . montelukast (SINGULAIR) 10 MG tablet    Sig: Take 1 tablet (10 mg total) by mouth at bedtime.    Dispense:  30 tablet    Refill:  5    Diagnostics: Spirometry reveals an FVC of 2.89 L and an FEV1 of 2.46 L (91% predicted) with 100 mL postbronchodilator improvement.  It is noteworthy that she took albuterol approximately 1 hour prior to testing.  Please see scanned spirometry results for details.    Physical examination: Blood pressure 136/76, pulse (!) 108, temperature 99.1 F (37.3 C), temperature source Oral, resp. rate 16, height 5' (1.524 m), weight 169 lb (76.7 kg), last menstrual period 03/29/2020, SpO2 98 %.  General: Alert, interactive, in no acute distress. HEENT: TMs pearly gray, turbinates moderately edematous without discharge, post-pharynx mildly erythematous. Neck: Supple without lymphadenopathy. Lungs: Clear to auscultation without wheezing, rhonchi or rales. CV: Normal S1, S2 without murmurs. Skin: Warm  and dry, without lesions or rashes.  The following portions of the patient's history were reviewed and updated as appropriate: allergies, current medications, past family history, past medical history, past social history, past surgical history and problem list.  Current Outpatient Medications  Medication Sig Dispense Refill  . b complex vitamins capsule Take 1 capsule by mouth  daily.    . budesonide-formoterol (SYMBICORT) 160-4.5 MCG/ACT inhaler 2 puffs twice daily with spacer to prevent coughing or wheezing. Rinse,gargle and spit after use. 1 each 5  . Cholecalciferol (VITAMIN D3) 125 MCG (5000 UT) TABS Take by mouth.    . fluocinonide (LIDEX) 0.05 % external solution     . hydrOXYzine (ATARAX/VISTARIL) 25 MG tablet Take 1 tablet (25 mg total) by mouth every 6 (six) hours as needed for anxiety or itching (appetite). 20 tablet 0  . ketoconazole (NIZORAL) 2 % shampoo Apply 1 application topically 2 (two) times a week.    . loratadine (CLARITIN) 10 MG tablet Take 1 tablet (10 mg total) by mouth daily. 30 tablet 5  . montelukast (SINGULAIR) 10 MG tablet Take 1 tablet (10 mg total) by mouth at bedtime. 30 tablet 5  . Prenatal Vit-Fe Fumarate-FA (PRENATAL VITAMIN PO) Take by mouth.    . VENTOLIN HFA 108 (90 Base) MCG/ACT inhaler INHALE 2 PUFFS INTO THE LUNGS EVERY 4 (FOUR) HOURS AS NEEDED FOR WHEEZING OR SHORTNESS OF BREATH. 18 Inhaler 1  . fluticasone (FLONASE) 50 MCG/ACT nasal spray Place 2 sprays into both nostrils daily as needed. (Patient not taking: Reported on 04/22/2020) 16 g 5  . ipratropium (ATROVENT) 0.06 % nasal spray Place 2 sprays into both nostrils 3 (three) times daily as needed. 15 mL 5  . Olopatadine HCl (PATADAY) 0.2 % SOLN Place 1 drop into both eyes daily as needed. (Patient not taking: Reported on 04/22/2020) 2.5 mL 5  . ondansetron (ZOFRAN-ODT) 4 MG disintegrating tablet Take 1 tablet (4 mg total) by mouth every 8 (eight) hours as needed for nausea or vomiting. (Patient not taking: Reported on 04/22/2020) 20 tablet 0   No current facility-administered medications for this visit.    No Known Allergies  Review of systems: Review of systems negative except as noted in HPI / PMHx.  Past Medical History:  Diagnosis Date  . Allergic rhinitis   . Anxiety   . Asthma   . Vitamin D deficiency     Family History  Problem Relation Age of Onset  .  Hypertension Father   . Diabetes Father   . Heart disease Father   . Allergic rhinitis Sister   . Asthma Sister   . Angioedema Neg Hx   . Eczema Neg Hx   . Immunodeficiency Neg Hx   . Urticaria Neg Hx     Social History   Socioeconomic History  . Marital status: Single    Spouse name: Not on file  . Number of children: Not on file  . Years of education: Not on file  . Highest education level: Not on file  Occupational History  . Not on file  Tobacco Use  . Smoking status: Never Smoker  . Smokeless tobacco: Never Used  Vaping Use  . Vaping Use: Never used  Substance and Sexual Activity  . Alcohol use: No  . Drug use: No  . Sexual activity: Not on file  Other Topics Concern  . Not on file  Social History Narrative  . Not on file   Social Determinants of Health   Financial Resource  Strain:   . Difficulty of Paying Living Expenses: Not on file  Food Insecurity:   . Worried About Programme researcher, broadcasting/film/video in the Last Year: Not on file  . Ran Out of Food in the Last Year: Not on file  Transportation Needs:   . Lack of Transportation (Medical): Not on file  . Lack of Transportation (Non-Medical): Not on file  Physical Activity:   . Days of Exercise per Week: Not on file  . Minutes of Exercise per Session: Not on file  Stress:   . Feeling of Stress : Not on file  Social Connections:   . Frequency of Communication with Friends and Family: Not on file  . Frequency of Social Gatherings with Friends and Family: Not on file  . Attends Religious Services: Not on file  . Active Member of Clubs or Organizations: Not on file  . Attends Banker Meetings: Not on file  . Marital Status: Not on file  Intimate Partner Violence:   . Fear of Current or Ex-Partner: Not on file  . Emotionally Abused: Not on file  . Physically Abused: Not on file  . Sexually Abused: Not on file    I appreciate the opportunity to take part in Stacey Savage's care. Please do not hesitate to  contact me with questions.  Sincerely,   R. Jorene Guest, MD

## 2020-04-22 NOTE — Assessment & Plan Note (Addendum)
   Continue appropriate allergen avoidance measures.  Fluticasone nasal spray, 2 spray per nostril daily as needed.  A prescription has been provided for ipratropium nasal spray 0.06%, 1 to 2 sprays per nostril 2-3 times daily as needed.  Nasal saline spray (i.e., Simply Saline) or nasal saline lavage (i.e., NeilMed) is recommended as needed and prior to medicated nasal sprays.  If allergen avoidance measures and medications fail to adequately relieve symptoms, aeroallergen immunotherapy will be considered.

## 2020-04-22 NOTE — Assessment & Plan Note (Signed)
   I have recommended that the patient be Covid tested.  If Covid test is negative, start prednisone, 20 mg x 4 days, 10 mg x1 day, then stop.  For now, and during all respiratory tract infections and/or asthma flares, add Symbicort 160-4.5 g, 2 inhalations via spacer device twice daily, until symptoms have returned to baseline.  Restart montelukast 10 mg daily.  Albuterol every 4-6 hours if needed.  The patient has been asked to contact me if her symptoms persist or progress. Otherwise, she may return for follow up in 4 months.

## 2020-04-22 NOTE — Patient Instructions (Addendum)
Asthma with acute exacerbation  I have recommended that the patient be Covid tested.  If Covid test is negative, start prednisone, 20 mg x 4 days, 10 mg x1 day, then stop.  For now, and during all respiratory tract infections and/or asthma flares, add Symbicort 160-4.5 g, 2 inhalations via spacer device twice daily, until symptoms have returned to baseline.  Restart montelukast 10 mg daily.  Albuterol every 4-6 hours if needed.  The patient has been asked to contact me if her symptoms persist or progress. Otherwise, she may return for follow up in 4 months.  Allergic rhinitis  Continue appropriate allergen avoidance measures.  Fluticasone nasal spray, 2 spray per nostril daily as needed.  A prescription has been provided for ipratropium nasal spray 0.06%, 1 to 2 sprays per nostril 2-3 times daily as needed.  Nasal saline spray (i.e., Simply Saline) or nasal saline lavage (i.e., NeilMed) is recommended as needed and prior to medicated nasal sprays.  If allergen avoidance measures and medications fail to adequately relieve symptoms, aeroallergen immunotherapy will be considered.   Return in about 4 months (around 08/22/2020), or if symptoms worsen or fail to improve.

## 2020-04-23 ENCOUNTER — Telehealth: Payer: Self-pay | Admitting: Allergy

## 2020-04-23 NOTE — Telephone Encounter (Signed)
Patient called after hours as she was concerned about her breathing.  She is having some chest tightness but has not used her albuterol since her office visit.  She was not sure if she was allowed to use it again and if it was okay to use the Symbicort. She was unable to get Covid tested on 8/31 but has an appointment scheduled for 9/1.   Advised patient that it was okay to take 2 puffs of albuterol now and take the Symbicort 160 mcg 2 puffs afterwards. Reviewed inhaler technique with spacer. Recommended rinsing out mouth after Symbicort use.  She will hold off taking the prednisone until she gets the COVID-19 testing done in the morning.  Patient was talking in full sentences during the conversation without any respiratory distress. She did have a dry cough here and there at times.

## 2020-04-26 ENCOUNTER — Other Ambulatory Visit: Payer: Self-pay

## 2020-04-26 ENCOUNTER — Emergency Department (HOSPITAL_BASED_OUTPATIENT_CLINIC_OR_DEPARTMENT_OTHER)
Admission: EM | Admit: 2020-04-26 | Discharge: 2020-04-26 | Disposition: A | Discharge status: Home / Self Care | Payer: 59 | Attending: Emergency Medicine | Admitting: Emergency Medicine

## 2020-04-26 ENCOUNTER — Encounter (HOSPITAL_BASED_OUTPATIENT_CLINIC_OR_DEPARTMENT_OTHER): Payer: Self-pay

## 2020-04-26 DIAGNOSIS — H539 Unspecified visual disturbance: Secondary | ICD-10-CM

## 2020-04-26 DIAGNOSIS — M542 Cervicalgia: Secondary | ICD-10-CM | POA: Insufficient documentation

## 2020-04-26 DIAGNOSIS — J45909 Unspecified asthma, uncomplicated: Secondary | ICD-10-CM | POA: Insufficient documentation

## 2020-04-26 DIAGNOSIS — M791 Myalgia, unspecified site: Secondary | ICD-10-CM

## 2020-04-26 DIAGNOSIS — Z79899 Other long term (current) drug therapy: Secondary | ICD-10-CM | POA: Diagnosis not present

## 2020-04-26 DIAGNOSIS — M7918 Myalgia, other site: Secondary | ICD-10-CM | POA: Insufficient documentation

## 2020-04-26 DIAGNOSIS — R519 Headache, unspecified: Secondary | ICD-10-CM | POA: Insufficient documentation

## 2020-04-26 DIAGNOSIS — H53129 Transient visual loss, unspecified eye: Secondary | ICD-10-CM | POA: Diagnosis not present

## 2020-04-26 NOTE — ED Provider Notes (Signed)
[facility] EMERGENCY DEPARTMENT Provider Note   CSN: [id number] Arrival date & time: 04/26/20  1546     History Chief Complaint  Patient presents with  . Headache    Carissa Musick is a 39 y.o. female.  The history is provided by the patient and medical records. No language interpreter was used.  Headache Pain location:  Generalized Quality:  Dull Radiates to:  Does not radiate Severity currently:  0/10 Severity at highest:  6/10 Onset quality:  Gradual Duration:  1 day Timing:  Intermittent Progression:  Resolved Chronicity:  New Similar to prior headaches: yes   Relieved by:  Nothing Worsened by:  Nothing Ineffective treatments:  None tried Associated symptoms: blurred vision (right eye), myalgias (resolved), neck pain, paresthesias (resolved) and tingling (resolved)   Associated symptoms: no abdominal pain, no back pain, no congestion, no cough, no diarrhea, no dizziness, no eye pain, no fatigue, no fever, no focal weakness, no nausea, no neck stiffness, no numbness, no photophobia, no sinus pressure, no syncope, no URI, no vomiting and no weakness        Past Medical History:  Diagnosis Date  . Allergic rhinitis   . Anxiety   . Asthma   . Vitamin D deficiency     Patient Active Problem List   Diagnosis Date Noted  . Asthma with acute exacerbation 04/22/2020  . Mild persistent asthma 01/31/2020  . Allergic rhinitis 01/31/2020  . Allergic conjunctivitis 01/31/2020    History reviewed. No pertinent surgical history.   OB History   No obstetric history on file.     Family History  Problem Relation Age of Onset  . Hypertension Father   . Diabetes Father   . Heart disease Father   . Allergic rhinitis Sister   . Asthma Sister   . Angioedema Neg Hx   . Eczema Neg Hx   . Immunodeficiency Neg Hx   . Urticaria Neg Hx     Social History   Tobacco Use  . Smoking status: Never Smoker  . Smokeless tobacco: Never Used  Vaping Use  .  Vaping Use: Never used  Substance Use Topics  . Alcohol use: No  . Drug use: No    Home Medications Prior to Admission medications   Medication Sig Start Date End Date Taking? Authorizing Provider  b complex vitamins capsule Take 1 capsule by mouth daily.    [provider]  budesonide-formoterol (SYMBICORT) 160-4.5 MCG/ACT inhaler 2 puffs twice daily with spacer to prevent coughing or wheezing. Rinse,gargle and spit after use. 04/22/20   Bobbitt, Heywood Iles, MD  Cholecalciferol (VITAMIN D3) 125 MCG (5000 UT) TABS Take by mouth.    [provider]  fluocinonide (LIDEX) 0.05 % external solution  01/15/20   [provider]  fluticasone (FLONASE) 50 MCG/ACT nasal spray Place 2 sprays into both nostrils daily as needed. Patient not taking: Reported on 04/22/2020 02/01/20   Bobbitt, Heywood Iles, MD  hydrOXYzine (ATARAX/VISTARIL) 25 MG tablet Take 1 tablet (25 mg total) by mouth every 6 (six) hours as needed for anxiety or itching (appetite). 04/05/20   Dione Booze, MD  ipratropium (ATROVENT) 0.06 % nasal spray Place 2 sprays into both nostrils 3 (three) times daily as needed. 04/22/20   Bobbitt, Heywood Iles, MD  ketoconazole (NIZORAL) 2 % shampoo Apply 1 application topically 2 (two) times a week. 01/15/20   [provider]  loratadine (CLARITIN) 10 MG tablet Take 1 tablet (10 mg total) by mouth daily. 01/31/20  Bobbitt, Heywood Iles, MD  montelukast (SINGULAIR) 10 MG tablet Take 1 tablet (10 mg total) by mouth at bedtime. 04/22/20   Bobbitt, Heywood Iles, MD  Olopatadine HCl (PATADAY) 0.2 % SOLN Place 1 drop into both eyes daily as needed. Patient not taking: Reported on 04/22/2020 01/31/20   Bobbitt, Heywood Iles, MD  ondansetron (ZOFRAN-ODT) 4 MG disintegrating tablet Take 1 tablet (4 mg total) by mouth every 8 (eight) hours as needed for nausea or vomiting. Patient not taking: Reported on 04/22/2020 04/05/20   Dione Booze, MD  Prenatal Vit-Fe Fumarate-FA (PRENATAL  VITAMIN PO) Take by mouth.    [provider]  VENTOLIN HFA 108 (90 Base) MCG/ACT inhaler INHALE 2 PUFFS INTO THE LUNGS EVERY 4 (FOUR) HOURS AS NEEDED FOR WHEEZING OR SHORTNESS OF BREATH. 01/22/19   Kozlow, Alvira Philips, MD    Allergies    Patient has no known allergies.  Review of Systems   Review of Systems  Constitutional: Negative for chills, diaphoresis, fatigue and fever.  HENT: Negative for congestion and sinus pressure.   Eyes: Positive for blurred vision (right eye). Negative for photophobia, pain and visual disturbance.  Respiratory: Negative for cough, chest tightness, shortness of breath, wheezing and stridor.   Cardiovascular: Negative for chest pain, palpitations, leg swelling and syncope.  Gastrointestinal: Negative for abdominal pain, blood in stool, constipation, diarrhea, nausea and vomiting.  Genitourinary: Negative for dysuria, flank pain and frequency.  Musculoskeletal: Positive for myalgias (resolved) and neck pain. Negative for back pain and neck stiffness.  Skin: Negative for rash and wound.  Neurological: Positive for headaches and paresthesias (resolved). Negative for dizziness, focal weakness, weakness, light-headedness and numbness.  Psychiatric/Behavioral: Negative for agitation and confusion.  All other systems reviewed and are negative.   Physical Exam Updated Vital Signs BP 106/71 (BP Location: Right Arm)   Pulse 66   Temp 98.6 F (37 C) (Oral)   Resp 18   Ht 5' (1.524 m)   Wt 72.1 kg   LMP 03/29/2020   SpO2 100%   BMI 31.05 kg/m   Physical Exam Vitals and nursing note reviewed.  Constitutional:      General: She is not in acute distress.    Appearance: She is well-developed. She is not ill-appearing, toxic-appearing or diaphoretic.  HENT:     Head: Normocephalic and atraumatic.     Mouth/Throat:     Mouth: Mucous membranes are moist.  Eyes:     Extraocular Movements: Extraocular movements intact.     Conjunctiva/sclera: Conjunctivae  normal.     Pupils: Pupils are equal, round, and reactive to light.  Cardiovascular:     Rate and Rhythm: Normal rate and regular rhythm.     Heart sounds: Normal heart sounds. No murmur heard.   Pulmonary:     Effort: Pulmonary effort is normal. No respiratory distress.     Breath sounds: Normal breath sounds. No wheezing, rhonchi or rales.  Chest:     Chest wall: No tenderness.  Abdominal:     Palpations: Abdomen is soft.     Tenderness: There is no abdominal tenderness.  Musculoskeletal:     Cervical back: Neck supple.  Skin:    General: Skin is warm and dry.     Capillary Refill: Capillary refill takes less than 2 seconds.  Neurological:     Mental Status: She is alert.     GCS: GCS eye subscore is 4. GCS verbal subscore is 5. GCS motor subscore is 6.  Cranial Nerves: No cranial nerve deficit, dysarthria or facial asymmetry.     Motor: No weakness.  Psychiatric:        Mood and Affect: Mood is anxious.     ED Results / Procedures / Treatments   Labs (all labs ordered are listed, but only abnormal results are displayed) Labs Reviewed - No data to display  EKG None  Radiology No results found.  Procedures Procedures (including critical care time)  Medications Ordered in ED Medications - No data to display  ED Course  I have reviewed the triage vital signs and the nursing notes.  Pertinent labs & imaging results that were available during my care of the patient were reviewed by me and considered in my medical decision making (see chart for details).    MDM Rules/Calculators/A&P                          Anora Schwenke is a 39 y.o. female with a past medical history significant for asthma and anxiety who presents with continued intermittent headache and intermittent right vision blurriness. Patient reports that for the last 2 months, she has been having a variety of medical complaints and illnesses. She reports that it all started 2 months ago when she had a  URI similar to what her family had. She says that since that time she has had intermittent chest discomfort which has had a reassuring work-up, left arm tingling, numbness, and pain, as well as lower extremity symptoms of tingling and pain. She reports that recently she is having some neck pain and some headache but no fevers or chills. She says she has been tested numerous times for Covid and were negative. She is currently on steroids as her PCP felt that this was contributing to an asthma exacerbation causing her chest discomfort. She reports that her symptoms have overall improved on the steroids but today she started having some right eye blurry vision which she has not had in the past. It lasted several hours and then resolved by the time she was getting evaluated in the emergency department. She reports she had headache about the same time. She denies history of migraine headaches to her knowledge or recent trauma. She otherwise is feeling well.  On my initial evaluation and history gathering, she has no symptoms whatsoever with no headache, vision changes, nausea vomiting, extremity symptoms or neck pain.  On my exam, no focal neurologic deficits. Patient is well-appearing. No evidence of myelopathy on exam. No neck tenderness. No bruit appreciated. Lungs clear and chest nontender. Abdomen nontender. Patient well-appearing with reassuring vital signs.  As patient had a head CT that was reassuring 1 month ago and had other work-up that was reassuring as well, we had a shared decision-making conversation for doing any further work-up today as she is asymptomatic. We have very low suspicion for TIA given her symptoms of headache with it and suspect it was a complicated migraine. We also discussed that given her vague tingling symptoms in different extremities as well as the headache, neck pain, and the vision changes transiently, MS is considered. She is currently on low-dose steroids for the asthma  exacerbation.  I did discuss with neurology and neurology did not feel that emergent MRI was needed tonight but he did feel that outpatient MRI with and without contrast of her head and C-spine was very reasonable to make sure there is no evidence of MS. She was given instructions to call  outpatient neurology and patient agreed with plan of care to follow-up with both outpatient neurology and a rheumatologist which she is scheduled to see soon. Patient agreed with plan of care as well as return precautions and was discharged asymptomatic with no further complaints and was well-appearing on exam.  Final Clinical Impression(s) / ED Diagnoses Final diagnoses:  Transient vision disturbance, right  Acute nonintractable headache, unspecified headache type  Myalgia     Clinical Impression: 1. Transient vision disturbance, right   2. Acute nonintractable headache, unspecified headache type   3. Myalgia     Disposition: Discharge  Condition: Good  I have discussed the results, Dx and Tx plan with the pt(& family if present). He/she/they expressed understanding and agree(s) with the plan. Discharge instructions discussed at great length. Strict return precautions discussed and pt &/or family have verbalized understanding of the instructions. No further questions at time of discharge.    Discharge Medication List as of 04/26/2020 11:01 PM      [facility]: [facility] [address]     [address] [place] [phone]    [facility] [address] [id number] HQ [id number] [address] [place] [id number] [phone]       Floriene Jeschke, Canary Brimhristopher J, MD 04/27/20 [phone]

## 2020-04-26 NOTE — ED Triage Notes (Signed)
Pt arrives ambulatory to ED with c/o headache and neck pain. To right side.

## 2020-04-26 NOTE — Discharge Instructions (Signed)
Your exam today was very reassuring and your history is consistent with possible atypical and complicated migraine causing your transient right vision changes.  I had a long discussion with the neurology team who recommended outpatient MRI with and without contrast of your brain but they did not feel that more extensive work-up tonight was needed as you are back to your baseline.  After our shared decision-making conversation, we feel this is appropriate.  Please follow-up with the outpatient neurology team as well as your rheumatologist as we discussed.  If any symptoms change or worsen, please return to the nearest emergency department.

## 2020-05-08 ENCOUNTER — Encounter: Payer: Self-pay | Admitting: Allergy and Immunology

## 2020-05-08 ENCOUNTER — Other Ambulatory Visit: Payer: Self-pay

## 2020-05-08 ENCOUNTER — Ambulatory Visit: Payer: 59 | Admitting: Allergy and Immunology

## 2020-05-08 VITALS — BP 110/72 | HR 75 | Temp 98.4°F | Resp 18 | Ht 60.0 in | Wt 169.0 lb

## 2020-05-08 DIAGNOSIS — J3089 Other allergic rhinitis: Secondary | ICD-10-CM | POA: Diagnosis not present

## 2020-05-08 DIAGNOSIS — Z91018 Allergy to other foods: Secondary | ICD-10-CM | POA: Diagnosis not present

## 2020-05-08 DIAGNOSIS — J4530 Mild persistent asthma, uncomplicated: Secondary | ICD-10-CM | POA: Diagnosis not present

## 2020-05-08 DIAGNOSIS — H1013 Acute atopic conjunctivitis, bilateral: Secondary | ICD-10-CM

## 2020-05-08 NOTE — Assessment & Plan Note (Addendum)
Aeroallergen avoidance measures have been discussed and provided in written form.  Continue appropriate allergen avoidance measures.  Fluticasone nasal spray, 2 spray per nostril daily as needed.  Ipratropium nasal spray 0.06%, 1 to 2 sprays per nostril 2-3 times daily as needed.  Nasal saline spray (i.e., Simply Saline) or nasal saline lavage (i.e., NeilMed) is recommended as needed and prior to medicated nasal sprays.  The risks and benefits of aeroallergen immunotherapy have been discussed. The patient is motivated to initiate immunotherapy if insurance coverage is favorable. She will let us know how she would like to proceed.

## 2020-05-08 NOTE — Progress Notes (Signed)
Follow-up Note  RE: Stacey Savage MRN: [id number] DOB: [birth year] Date of Office Visit: 05/08/2020  Primary care provider: Mendel Ryder, PA-C Referring provider: Mendel Ryder, PA-C  History of present illness: Stacey Savage is a 39 y.o. female with asthma and allergic rhinoconjunctivitis presenting today for follow-up and allergy skin testing.  He was last seen in this clinic on April 22, 2020 for asthma exacerbation.  She has been off of all antihistamines over the past 3 days in anticipation of today's testing.  She is interested in the possibility of starting aeroallergen immunotherapy to reduce symptoms and decrease medication requirement.  She reports that her Covid test was negative and she started on the prednisone which helped to bring about resolution of the asthma exacerbation.  She has been doing well from an asthma standpoint since that time.  She does note that approximately 1 month ago she consumed an almond and experienced "a weird sensation" in her chest, accompanied by coughing.  She experienced similar symptoms approximately 1 week ago when consuming almonds.  She is interested in reassessing her environmental allergen status and evaluating the possibility of food allergy.  Assessment and plan: Allergic rhinitis Aeroallergen avoidance measures have been discussed and provided in written form.  Continue appropriate allergen avoidance measures.  Fluticasone nasal spray, 2 spray per nostril daily as needed.  Ipratropium nasal spray 0.06%, 1 to 2 sprays per nostril 2-3 times daily as needed.  Nasal saline spray (i.e., Simply Saline) or nasal saline lavage (i.e., NeilMed) is recommended as needed and prior to medicated nasal sprays.  The risks and benefits of aeroallergen immunotherapy have been discussed. The patient is motivated to initiate immunotherapy if insurance coverage is favorable. She will let us know how she would like to proceed.  Allergic conjunctivitis   Treatment plan as outlined above for allergic rhinitis.  Continue olopatadine 0.2%, one drop per eye daily as needed.  If insurance does not cover this medication, medicated allergy eyedrops may be purchased over-the-counter as Art therapist.  Eye lubricant drops (Natural Tears) as needed.  Mild persistent asthma  Continue montelukast 10 mg daily at bedtime.  Continue albuterol HFA, 1 to 2 inhalations every 4-6 hours if needed.  During upper respiratory tract infections or asthma flares, add Symbicort 160-4.5 g, 2 inhalations via spacer device twice daily until symptoms have returned to baseline.  Subjective and objective measures of pulmonary function will be followed and the treatment plan will be adjusted accordingly.  History of food allergy Possible food allergy.  The patients history suggests tree nut allergy, though todays skin tests were negative despite a positive histamine control.  Food allergen skin testing has excellent negative predictive value however there is still a 5% chance that the allergy exists.  Therefore, we will investigate further with serum specific IgE levels and, if negative, open graded oral challenge.  A laboratory order form has been provided for serum specific IgE against tree nut panel.  Until the food allergy has been definitively ruled out, the patient is to continue careful avoidance and have access to epinephrine autoinjector 2 pack.   Diagnostics: Spirometry:  Normal with an FEV1 of 86% predicted with an FEV1 ratio of 98%. This study was performed while the patient was asymptomatic.  Please see scanned spirometry results for details. Environmental skin testing: Positive to grass pollen, weed pollen, ragweed pollen, tree pollen, molds, cat hair, dog epithelia, rabbit epithelia, cockroach antigen, and dust mite antigen. Food allergen skin testing: Negative despite  a positive histamine control.    Physical examination: Blood  pressure 110/72, pulse 75, temperature 98.4 F (36.9 C), temperature source Oral, resp. rate 18, height 5' (1.524 m), weight 169 lb (76.7 kg), SpO2 98 %.  General: Alert, interactive, in no acute distress. HEENT: TMs pearly gray, turbinates moderately edematous with crusty discharge, post-pharynx moderately erythematous. Neck: Supple without lymphadenopathy. Lungs: Clear to auscultation without wheezing, rhonchi or rales. CV: Normal S1, S2 without murmurs. Skin: Warm and dry, without lesions or rashes.  The following portions of the patient's history were reviewed and updated as appropriate: allergies, current medications, past family history, past medical history, past social history, past surgical history and problem list.   Current Outpatient Medications  Medication Sig Dispense Refill  . b complex vitamins capsule Take 1 capsule by mouth daily.    Marland Kitchen BIOTIN PO Take by mouth.    . budesonide-formoterol (SYMBICORT) 160-4.5 MCG/ACT inhaler 2 puffs twice daily with spacer to prevent coughing or wheezing. Rinse,gargle and spit after use. 1 each 5  . Cholecalciferol (VITAMIN D3) 125 MCG (5000 UT) TABS Take by mouth.    Marland Kitchen ketoconazole (NIZORAL) 2 % shampoo Apply 1 application topically 2 (two) times a week.    . loratadine (CLARITIN) 10 MG tablet Take 1 tablet (10 mg total) by mouth daily. 30 tablet 5  . montelukast (SINGULAIR) 10 MG tablet Take 1 tablet (10 mg total) by mouth at bedtime. 30 tablet 5  . Prenatal Vit-Fe Fumarate-FA (PRENATAL VITAMIN PO) Take by mouth.    . Turmeric (QC TUMERIC COMPLEX PO) Take by mouth.    . VENTOLIN HFA 108 (90 Base) MCG/ACT inhaler INHALE 2 PUFFS INTO THE LUNGS EVERY 4 (FOUR) HOURS AS NEEDED FOR WHEEZING OR SHORTNESS OF BREATH. 18 Inhaler 1  . fluocinonide (LIDEX) 0.05 % external solution  (Patient not taking: Reported on 05/08/2020)    . fluticasone (FLONASE) 50 MCG/ACT nasal spray Place 2 sprays into both nostrils daily as needed. (Patient not taking:  Reported on 05/08/2020) 16 g 5  . hydrOXYzine (ATARAX/VISTARIL) 25 MG tablet Take 1 tablet (25 mg total) by mouth every 6 (six) hours as needed for anxiety or itching (appetite). (Patient not taking: Reported on 05/08/2020) 20 tablet 0  . ipratropium (ATROVENT) 0.06 % nasal spray Place 2 sprays into both nostrils 3 (three) times daily as needed. (Patient not taking: Reported on 05/08/2020) 15 mL 5  . Olopatadine HCl (PATADAY) 0.2 % SOLN Place 1 drop into both eyes daily as needed. (Patient not taking: Reported on 05/08/2020) 2.5 mL 5   No current facility-administered medications for this visit.    No Known Allergies  Review of systems: Review of systems negative except as noted in HPI / PMHx.  Past Medical History:  Diagnosis Date  . Allergic rhinitis   . Anxiety   . Asthma   . Vitamin D deficiency     Family History  Problem Relation Age of Onset  . Hypertension Father   . Diabetes Father   . Heart disease Father   . Allergic rhinitis Sister   . Asthma Sister   . Angioedema Neg Hx   . Eczema Neg Hx   . Immunodeficiency Neg Hx   . Urticaria Neg Hx     Social History   Socioeconomic History  . Marital status: Single    Spouse name: Not on file  . Number of children: Not on file  . Years of education: Not on file  . Highest education level: Not  on file  Occupational History  . Not on file  Tobacco Use  . Smoking status: Never Smoker  . Smokeless tobacco: Never Used  Vaping Use  . Vaping Use: Never used  Substance and Sexual Activity  . Alcohol use: No  . Drug use: No  . Sexual activity: Not on file  Other Topics Concern  . Not on file  Social History Narrative  . Not on file   Social Determinants of Health   Financial Resource Strain:   . Difficulty of Paying Living Expenses: Not on file  Food Insecurity:   . Worried About Programme researcher, broadcasting/film/video in the Last Year: Not on file  . Ran Out of Food in the Last Year: Not on file  Transportation Needs:   . Lack of  Transportation (Medical): Not on file  . Lack of Transportation (Non-Medical): Not on file  Physical Activity:   . Days of Exercise per Week: Not on file  . Minutes of Exercise per Session: Not on file  Stress:   . Feeling of Stress : Not on file  Social Connections:   . Frequency of Communication with Friends and Family: Not on file  . Frequency of Social Gatherings with Friends and Family: Not on file  . Attends Religious Services: Not on file  . Active Member of Clubs or Organizations: Not on file  . Attends Banker Meetings: Not on file  . Marital Status: Not on file  Intimate Partner Violence:   . Fear of Current or Ex-Partner: Not on file  . Emotionally Abused: Not on file  . Physically Abused: Not on file  . Sexually Abused: Not on file    I appreciate the opportunity to take part in Stacey Savage's care. Please do not hesitate to contact me with questions.  Sincerely,   R. Jorene Guest, MD

## 2020-05-08 NOTE — Assessment & Plan Note (Signed)
   Treatment plan as outlined above for allergic rhinitis.  Continue olopatadine 0.2%, one drop per eye daily as needed.  If insurance does not cover this medication, medicated allergy eyedrops may be purchased over-the-counter as Art therapist.  Eye lubricant drops (Natural Tears) as needed.

## 2020-05-08 NOTE — Assessment & Plan Note (Signed)
Possible food allergy.  The patients history suggests tree nut allergy, though todays skin tests were negative despite a positive histamine control.  Food allergen skin testing has excellent negative predictive value however there is still a 5% chance that the allergy exists.  Therefore, we will investigate further with serum specific IgE levels and, if negative, open graded oral challenge.  A laboratory order form has been provided for serum specific IgE against tree nut panel.  Until the food allergy has been definitively ruled out, the patient is to continue careful avoidance and have access to epinephrine autoinjector 2 pack.

## 2020-05-08 NOTE — Assessment & Plan Note (Signed)
   Continue montelukast 10 mg daily at bedtime.  Continue albuterol HFA, 1 to 2 inhalations every 4-6 hours if needed.  During upper respiratory tract infections or asthma flares, add Symbicort 160-4.5 g, 2 inhalations via spacer device twice daily until symptoms have returned to baseline.  Subjective and objective measures of pulmonary function will be followed and the treatment plan will be adjusted accordingly.

## 2020-05-08 NOTE — Patient Instructions (Addendum)
Allergic rhinitis Aeroallergen avoidance measures have been discussed and provided in written form.  Continue appropriate allergen avoidance measures.  Fluticasone nasal spray, 2 spray per nostril daily as needed.  Ipratropium nasal spray 0.06%, 1 to 2 sprays per nostril 2-3 times daily as needed.  Nasal saline spray (i.e., Simply Saline) or nasal saline lavage (i.e., NeilMed) is recommended as needed and prior to medicated nasal sprays.  The risks and benefits of aeroallergen immunotherapy have been discussed. The patient is motivated to initiate immunotherapy if insurance coverage is favorable. She will let us know how she would like to proceed.  Allergic conjunctivitis  Treatment plan as outlined above for allergic rhinitis.  Continue olopatadine 0.2%, one drop per eye daily as needed.  If insurance does not cover this medication, medicated allergy eyedrops may be purchased over-the-counter as Art therapist.  Eye lubricant drops (Natural Tears) as needed.  Mild persistent asthma  Continue montelukast 10 mg daily at bedtime.  Continue albuterol HFA, 1 to 2 inhalations every 4-6 hours if needed.  During upper respiratory tract infections or asthma flares, add Symbicort 160-4.5 g, 2 inhalations via spacer device twice daily until symptoms have returned to baseline.  Subjective and objective measures of pulmonary function will be followed and the treatment plan will be adjusted accordingly.  History of food allergy Possible food allergy.  The patients history suggests tree nut allergy, though todays skin tests were negative despite a positive histamine control.  Food allergen skin testing has excellent negative predictive value however there is still a 5% chance that the allergy exists.  Therefore, we will investigate further with serum specific IgE levels and, if negative, open graded oral challenge.  A laboratory order form has been provided for serum  specific IgE against tree nut panel.  Until the food allergy has been definitively ruled out, the patient is to continue careful avoidance and have access to epinephrine autoinjector 2 pack.   Return in about 4 months (around 09/07/2020), or if symptoms worsen or fail to improve.   Control of Dust Mite Allergen  House dust mites play a major role in allergic asthma and rhinitis.  They occur in environments with high humidity wherever human skin, the food for dust mites is found. High levels have been detected in dust obtained from mattresses, pillows, carpets, upholstered furniture, bed covers, clothes and soft toys.  The principal allergen of the house dust mite is found in its feces.  A gram of dust may contain 1,000 mites and 250,000 fecal particles.  Mite antigen is easily measured in the air during house cleaning activities.    1. Encase mattresses, including the box spring, and pillow, in an air tight cover.  Seal the zipper end of the encased mattresses with wide adhesive tape. 2. Wash the bedding in water of 130 degrees Farenheit weekly.  Avoid cotton comforters/quilts and flannel bedding: the most ideal bed covering is the dacron comforter. 3. Remove all upholstered furniture from the bedroom. 4. Remove carpets, carpet padding, rugs, and non-washable window drapes from the bedroom.  Wash drapes weekly or use plastic window coverings. 5. Remove all non-washable stuffed toys from the bedroom.  Wash stuffed toys weekly. 6. Have the room cleaned frequently with a vacuum cleaner and a damp dust-mop.  The patient should not be in a room which is being cleaned and should wait 1 hour after cleaning before going into the room. 7. Close and seal all heating outlets in the bedroom.  Otherwise,  the room will become filled with dust-laden air.  An electric heater can be used to heat the room. Reduce indoor humidity to less than 50%.  Do not use a humidifier.   Reducing Pollen Exposure  The  American Academy of Allergy, Asthma and Immunology suggests the following steps to reduce your exposure to pollen during allergy seasons.    1. Do not hang sheets or clothing out to dry; pollen may collect on these items. 2. Do not mow lawns or spend time around freshly cut grass; mowing stirs up pollen. 3. Keep windows closed at night.  Keep car windows closed while driving. 4. Minimize morning activities outdoors, a time when pollen counts are usually at their highest. 5. Stay indoors as much as possible when pollen counts or humidity is high and on windy days when pollen tends to remain in the air longer. 6. Use air conditioning when possible.  Many air conditioners have filters that trap the pollen spores. 7. Use a HEPA room air filter to remove pollen form the indoor air you breathe.   Control of Dog or Cat Allergen  Avoidance is the best way to manage a dog or cat allergy. If you have a dog or cat and are allergic to dog or cats, consider removing the dog or cat from the home. If you have a dog or cat but don't want to find it a new home, or if your family wants a pet even though someone in the household is allergic, here are some strategies that may help keep symptoms at bay:  1. Keep the pet out of your bedroom and restrict it to only a few rooms. Be advised that keeping the dog or cat in only one room will not limit the allergens to that room. 2. Don't pet, hug or kiss the dog or cat; if you do, wash your hands with soap and water. 3. High-efficiency particulate air (HEPA) cleaners run continuously in a bedroom or living room can reduce allergen levels over time. 4. Place electrostatic material sheet in the air inlet vent in the bedroom. 5. Regular use of a high-efficiency vacuum cleaner or a central vacuum can reduce allergen levels. 6. Giving your dog or cat a bath at least once a week can reduce airborne allergen.   Control of Mold Allergen  Mold and fungi can grow on a variety  of surfaces provided certain temperature and moisture conditions exist.  Outdoor molds grow on plants, decaying vegetation and soil.  The major outdoor mold, Alternaria and Cladosporium, are found in very high numbers during hot and dry conditions.  Generally, a late Summer - Fall peak is seen for common outdoor fungal spores.  Rain will temporarily lower outdoor mold spore count, but counts rise rapidly when the rainy period ends.  The most important indoor molds are Aspergillus and Penicillium.  Dark, humid and poorly ventilated basements are ideal sites for mold growth.  The next most common sites of mold growth are the bathroom and the kitchen.  Outdoor [employer] 1. Use air conditioning and keep windows closed 2. Avoid exposure to decaying vegetation. 3. Avoid leaf raking. 4. Avoid grain handling. 5. Consider wearing a face mask if working in moldy areas.  Indoor Mold Control 1. Maintain humidity below 50%. 2. Clean washable surfaces with 5% bleach solution. 3. Remove sources e.g. Contaminated carpets.   Control of Cockroach Allergen  Cockroach allergen has been identified as an important cause of acute attacks of asthma, especially in  urban settings.  There are fifty-five species of cockroach that exist in the [place], however only three, the [place], [place] species produce allergen that can affect patients with Asthma.  Allergens can be obtained from fecal particles, egg casings and secretions from cockroaches.    1. Remove food sources. 2. Reduce access to water. 3. Seal access and entry points. 4. Spray runways with 0.5-1% Diazinon or Chlorpyrifos 5. Blow boric acid power under stoves and refrigerator. 6. Place bait stations (hydramethylnon) at feeding sites.

## 2020-05-09 ENCOUNTER — Telehealth: Payer: Self-pay

## 2020-05-09 ENCOUNTER — Other Ambulatory Visit: Payer: Self-pay

## 2020-05-09 DIAGNOSIS — T7800XD Anaphylactic reaction due to unspecified food, subsequent encounter: Secondary | ICD-10-CM

## 2020-05-09 MED ORDER — EPINEPHRINE 0.3 MG/0.3ML IJ SOAJ: INTRAMUSCULAR | 1 refills | Status: DC

## 2020-05-09 NOTE — Telephone Encounter (Signed)
Spoke to pt. She will come by the office to pick up lab orders.

## 2020-05-09 NOTE — Telephone Encounter (Signed)
Sent in [place] q. Into [facility]. Pt. Aware.

## 2020-05-09 NOTE — Telephone Encounter (Signed)
Left message for Pt that Dr. Nunzio Cobbs wants her to get lab work done. He wants  specific Ige level against tree nut panel.

## 2020-05-16 LAB — ALLERGENS(7)
Brazil Nut IgE: <0.1 kU/L
F020-IgE Almond: <0.1 kU/L
F202-IgE Cashew Nut: <0.1 kU/L
Hazelnut (Filbert) IgE: <0.1 kU/L
Peanut IgE: <0.1 kU/L
Pecan Nut IgE: <0.1 kU/L
Walnut IgE: <0.1 kU/L

## 2020-05-16 LAB — ALLERGEN PISTACHIO F203: F203-IgE Pistachio Nut: <0.1 kU/L

## 2020-05-16 LAB — ALLERGEN COCONUT IGE: Allergen Coconut IgE: <0.1 kU/L

## 2020-05-26 DIAGNOSIS — J3089 Other allergic rhinitis: Secondary | ICD-10-CM

## 2020-05-26 NOTE — Progress Notes (Signed)
VIALS EXP 05-26-21

## 2020-05-28 DIAGNOSIS — J302 Other seasonal allergic rhinitis: Secondary | ICD-10-CM | POA: Diagnosis not present

## 2020-05-28 NOTE — Progress Notes (Signed)
ADDITIONAL LABELS NEEDED 

## 2020-06-13 ENCOUNTER — Other Ambulatory Visit: Payer: Self-pay

## 2020-06-13 ENCOUNTER — Ambulatory Visit (INDEPENDENT_AMBULATORY_CARE_PROVIDER_SITE_OTHER): Payer: 59

## 2020-06-13 DIAGNOSIS — J309 Allergic rhinitis, unspecified: Secondary | ICD-10-CM | POA: Diagnosis not present

## 2020-06-13 NOTE — Progress Notes (Signed)
Immunotherapy   Patient Details  Name: Stacey Savage MRN: [id number] Date of Birth: 1980/10/17  06/13/2020  [name] started injections for Blue 1:100,000 (W-[id number]/ M-CR/ G-T) Following schedule: A  Frequency:1 time per week Epi-Pen:Epi-Pen Available  Consent signed and patient instructions given.    [name] J [name] 06/13/2020, 3:03 PM

## 2020-06-17 ENCOUNTER — Ambulatory Visit (INDEPENDENT_AMBULATORY_CARE_PROVIDER_SITE_OTHER): Payer: 59

## 2020-06-17 DIAGNOSIS — J309 Allergic rhinitis, unspecified: Secondary | ICD-10-CM | POA: Diagnosis not present

## 2020-06-18 ENCOUNTER — Telehealth: Payer: Self-pay | Admitting: Allergy and Immunology

## 2020-06-18 ENCOUNTER — Other Ambulatory Visit: Payer: Self-pay

## 2020-06-18 MED ORDER — CETIRIZINE HCL 10 MG PO TABS: 10.0000 mg | ORAL_TABLET | Freq: Every day | ORAL | 5 refills | Status: DC | PRN

## 2020-06-18 NOTE — Telephone Encounter (Signed)
Pt would like to switch from loratidine to zyrtec. Send to [facility] on [address]

## 2020-06-18 NOTE — Telephone Encounter (Signed)
Yes may switch to cetirizine 10 mg daily as needed. Thanks.

## 2020-06-18 NOTE — Telephone Encounter (Signed)
Sent in rx and infromed pt of doing so she stated understanding

## 2020-06-18 NOTE — Telephone Encounter (Signed)
Please advise to change? 

## 2020-06-24 ENCOUNTER — Ambulatory Visit (INDEPENDENT_AMBULATORY_CARE_PROVIDER_SITE_OTHER): Payer: 59

## 2020-06-24 DIAGNOSIS — J309 Allergic rhinitis, unspecified: Secondary | ICD-10-CM | POA: Diagnosis not present

## 2020-06-30 ENCOUNTER — Ambulatory Visit (INDEPENDENT_AMBULATORY_CARE_PROVIDER_SITE_OTHER): Payer: 59

## 2020-06-30 DIAGNOSIS — J309 Allergic rhinitis, unspecified: Secondary | ICD-10-CM | POA: Diagnosis not present

## 2020-07-01 ENCOUNTER — Ambulatory Visit: Payer: 59 | Admitting: Allergy and Immunology

## 2020-07-02 ENCOUNTER — Ambulatory Visit: Payer: 59 | Admitting: Neurology

## 2020-07-02 ENCOUNTER — Encounter: Payer: Self-pay | Admitting: Neurology

## 2020-07-02 VITALS — BP 129/87 | HR 78 | Ht 60.0 in | Wt 160.0 lb

## 2020-07-02 DIAGNOSIS — R202 Paresthesia of skin: Secondary | ICD-10-CM

## 2020-07-02 DIAGNOSIS — G933 Postviral fatigue syndrome: Secondary | ICD-10-CM

## 2020-07-02 DIAGNOSIS — G9331 Postviral fatigue syndrome: Secondary | ICD-10-CM

## 2020-07-02 DIAGNOSIS — M542 Cervicalgia: Secondary | ICD-10-CM

## 2020-07-02 DIAGNOSIS — R531 Weakness: Secondary | ICD-10-CM

## 2020-07-02 DIAGNOSIS — R51 Headache with orthostatic component, not elsewhere classified: Secondary | ICD-10-CM

## 2020-07-02 DIAGNOSIS — H5460 Unqualified visual loss, one eye, unspecified: Secondary | ICD-10-CM | POA: Diagnosis not present

## 2020-07-02 DIAGNOSIS — R519 Headache, unspecified: Secondary | ICD-10-CM

## 2020-07-02 NOTE — Patient Instructions (Signed)
MRI brain and cervical spine with contrast

## 2020-07-02 NOTE — Progress Notes (Signed)
[facility]    Provider:  Dr Lucia GaskinsAhern Requesting Provider: Emergency room Primary Care Provider:  Mendel Ryderrias, Jacob, PA-C  CC:  Vision loss, headaches, myalgias and arthralgias  HPI:  Stacey Savage is a 39 y.o. female here as requested by Mendel Ryderrias, Jacob, PA-C for Transient visual disturbance in the setting of headache.  Past medical history of headache for past 2 months prior, anxiety, asthma.  I reviewed notes I reviewed notes from the emergency department from April 26, 2020 where patient presented with headache, it was generalized and dull, no radiation, when she was seen it was 0 out of 10 pain but at its highest can be 6 out of 10, gradual onset for a day intermittent but had resolved, it was similar to prior headaches, she did not try any medications, associated symptoms include blurred vision of the right eye, myalgias resolved, neck pain and paresthesias resolved and tingling resolved.  No other associated symptom, inciting event or focal neurologic deficit was stated.  Examination in the emergency room was normal including head, eyes, cardiovascular, pulmonary, musculoskeletal, neurologic including no cranial nerve deficit, dysarthria or facial asymmetry or motor weakness noted, she was anxious however.  Neurology did not feel that an emergent MRI was needed but that outpatient MRI with and without contrast of the brain and cervical spine was reasonable to make sure there is no evidence of multiple sclerosis.  It all started in July. Her daughter was sick and it started right after that. It was not Covid. Daughter had a rash with the virus. She was coughing and headaches and body aches. Started with the body aches. She started prednisone high dose and she had a reaction and she had to go to the ED. They stopped her prednisone and she went to the hospital. She stopped eating, she was weak. Nausea, Headaches severe, she had to be in the dark, nausea, pulsating pounding and  throbbing like a needle was poking her head. Slowly getting better. She still has neck pain and headaches and exercises help. One day she had vision loss and blurriness in the right eye, nothing helped, more than 2 hours she couldn't see out of the right eye. Her lower eyelid twitches. She is still having no energy. She is breast feding. Headaches on and off, can be positional, mild it starts in the back of the head. No other focal neurologic deficits, associated symptoms, inciting events or modifiable factors.  Reviewed notes, labs and imaging from outside physicians, which showed: see above  Review of Systems: Patient complains of symptoms per HPI as well as the following symptoms: muscle aches, joint pain. Pertinent negatives and positives per HPI. All others negative.   Social History   Socioeconomic History  . Marital status: Single    Spouse name: Not on file  . Number of children: 1  . Years of education: Not on file  . Highest education level: Not on file  Occupational History  . Not on file  Tobacco Use  . Smoking status: Never Smoker  . Smokeless tobacco: Never Used  Vaping Use  . Vaping Use: Never used  Substance and Sexual Activity  . Alcohol use: Yes    Alcohol/week: 1.0 - 3.0 standard drink    Types: 1 - 3 Standard drinks or equivalent per week  . Drug use: Not Currently    Types: Marijuana  . Sexual activity: Not on file  Other Topics Concern  . Not on file  Social History Narrative   Lives  at home with boyfriend and daughter   Right handed   Caffeine: 1-4 times a week   Social Determinants of Health   Financial Resource Strain:   . Difficulty of Paying Living Expenses: Not on file  Food Insecurity:   . Worried About Programme researcher, broadcasting/film/video in the Last Year: Not on file  . Ran Out of Food in the Last Year: Not on file  Transportation Needs:   . Lack of Transportation (Medical): Not on file  . Lack of Transportation (Non-Medical): Not on file  Physical  Activity:   . Days of Exercise per Week: Not on file  . Minutes of Exercise per Session: Not on file  Stress:   . Feeling of Stress : Not on file  Social Connections:   . Frequency of Communication with Friends and Family: Not on file  . Frequency of Social Gatherings with Friends and Family: Not on file  . Attends Religious Services: Not on file  . Active Member of Clubs or Organizations: Not on file  . Attends Banker Meetings: Not on file  . Marital Status: Not on file  Intimate Partner Violence:   . Fear of Current or Ex-Partner: Not on file  . Emotionally Abused: Not on file  . Physically Abused: Not on file  . Sexually Abused: Not on file    Family History  Problem Relation Age of Onset  . Hypertension Father   . Diabetes Father   . Heart disease Father   . High Cholesterol Father   . Anxiety disorder Father   . Allergic rhinitis Sister   . Asthma Sister   . Diabetes Maternal Grandmother   . Angioedema Neg Hx   . Eczema Neg Hx   . Immunodeficiency Neg Hx   . Urticaria Neg Hx     Past Medical History:  Diagnosis Date  . Allergic rhinitis   . Anxiety   . Asthma   . Vitamin D deficiency     Patient Active Problem List   Diagnosis Date Noted  . History of food allergy 05/08/2020  . Asthma with acute exacerbation 04/22/2020  . Mild persistent asthma 01/31/2020  . Allergic rhinitis 01/31/2020  . Allergic conjunctivitis 01/31/2020    Past Surgical History:  Procedure Laterality Date  . NO PAST SURGERIES      Current Outpatient Medications  Medication Sig Dispense Refill  . Acetaminophen (TYLENOL PO) Take 500 mg by mouth as needed.    Marland Kitchen b complex vitamins capsule Take 1 capsule by mouth daily.    Marland Kitchen BIOTIN PO Take by mouth.    . budesonide-formoterol (SYMBICORT) 160-4.5 MCG/ACT inhaler 2 puffs twice daily with spacer to prevent coughing or wheezing. Rinse,gargle and spit after use. 1 each 5  . cetirizine (ZYRTEC) 10 MG tablet Take 1 tablet (10  mg total) by mouth daily as needed for allergies. 30 tablet 5  . Cholecalciferol (VITAMIN D3) 125 MCG (5000 UT) TABS Take 2 each by mouth daily.     . fluocinonide (LIDEX) 0.05 % external solution     . fluticasone (FLONASE) 50 MCG/ACT nasal spray Place 2 sprays into both nostrils daily as needed. 16 g 5  . hydrOXYzine (ATARAX/VISTARIL) 25 MG tablet Take 1 tablet (25 mg total) by mouth every 6 (six) hours as needed for anxiety or itching (appetite). 20 tablet 0  . ketoconazole (NIZORAL) 2 % shampoo Apply 1 application topically 2 (two) times a week.    . montelukast (SINGULAIR) 10 MG tablet  Take 1 tablet (10 mg total) by mouth at bedtime. 30 tablet 5  . pantoprazole (PROTONIX) 20 MG tablet Take 20 mg by mouth daily.    . Prenatal Vit-Fe Fumarate-FA (PRENATAL VITAMIN PO) Take by mouth.    Marland Kitchen PRESCRIPTION MEDICATION once a week. Allergy shots    . Turmeric (QC TUMERIC COMPLEX PO) Take 2 tablets by mouth daily.     Marland Kitchen UNABLE TO FIND Med Name: CBD    . VENTOLIN HFA 108 (90 Base) MCG/ACT inhaler INHALE 2 PUFFS INTO THE LUNGS EVERY 4 (FOUR) HOURS AS NEEDED FOR WHEEZING OR SHORTNESS OF BREATH. 18 Inhaler 1  . EPINEPHrine (AUVI-Q) 0.3 mg/0.3 mL IJ SOAJ injection Use as directed for severe allergic reactions 2 each 1   No current facility-administered medications for this visit.    Allergies as of 07/02/2020  . (No Known Allergies)    Vitals: BP 129/87 (BP Location: Right Arm, Patient Position: Sitting)   Pulse 78   Ht 5' (1.524 m)   Wt 160 lb (72.6 kg)   Breastfeeding Yes   BMI 31.25 kg/m  Last Weight:  Wt Readings from Last 1 Encounters:  07/02/20 160 lb (72.6 kg)   Last Height:   Ht Readings from Last 1 Encounters:  07/02/20 5' (1.524 m)     Physical exam: Exam: Gen: anxious, conversant, well nourised,  well groomed                     CV: RRR, no MRG. No Carotid Bruits. No peripheral edema, warm, nontender Eyes: Conjunctivae clear without exudates or  hemorrhage  Neuro: Detailed Neurologic Exam  Speech:    Speech is normal; fluent and spontaneous with normal comprehension.  Cognition:    The patient is oriented to person, place, and time;     recent and remote memory intact;     language fluent;     normal attention, concentration,     fund of knowledge Cranial Nerves:    The pupils are equal, round, and reactive to light. The fundi are normal and spontaneous venous pulsations are present. Visual fields are full to finger confrontation. Extraocular movements are intact. Trigeminal sensation is intact and the muscles of mastication are normal. The face is symmetric. The palate elevates in the midline. Hearing intact. Voice is normal. Shoulder shrug is normal. The tongue has normal motion without fasciculations.   Coordination:    No dysmetria or ataxia  Gait:    Normal native gait  Motor Observation:    No asymmetry, no atrophy, and no involuntary movements noted. Tone:    Normal muscle tone.    Posture:    Posture is normal. normal erect    Strength:    Strength is V/V in the upper and lower limbs.      Sensation: intact to LT     Reflex Exam:  DTR's:    Deep tendon reflexes in the upper and lower extremities are normal bilaterally.   Toes:    The toes are downgoing bilaterally.   Clonus:    Clonus is absent.    Assessment/Plan: Patient seen at the emergency room for transient visual loss of the right eye.  In the setting of headache.Neurology did not feel that an emergent MRI was needed but that outpatient MRI with and without contrast of the brain and cervical spine was reasonable to make sure there is no evidence of multiple sclerosis.  May be a post-viral syndrome. Patient is very anxious, this  may be contributing to her symptoms as well.  Follow up with primary care   Orders Placed This Encounter  Procedures  . MR BRAIN W WO CONTRAST  . MR CERVICAL SPINE W WO CONTRAST  . Comprehensive metabolic panel    No orders of the defined types were placed in this encounter.   Cc: Rana Snare, PA-C  Naomie Dean, MD  [facility] [address] [place], [place] [address]  Phone [phone] Fax [phone]

## 2020-07-03 ENCOUNTER — Encounter: Payer: Self-pay | Admitting: Neurology

## 2020-07-03 LAB — COMPREHENSIVE METABOLIC PANEL
ALT: 21 IU/L (ref 0–32)
AST: 18 IU/L (ref 0–40)
Albumin/Globulin Ratio: 1.5 (ref 1.2–2.2)
Albumin: 4.4 g/dL (ref 3.8–4.8)
Alkaline Phosphatase: 122 IU/L — ABNORMAL HIGH (ref 44–121)
BUN/Creatinine Ratio: 15 (ref 9–23)
BUN: 9 mg/dL (ref 6–20)
Bilirubin Total: 0.3 mg/dL (ref 0.0–1.2)
CO2: 22 mmol/L (ref 20–29)
Calcium: 9.3 mg/dL (ref 8.7–10.2)
Chloride: 102 mmol/L (ref 96–106)
Creatinine, Ser: 0.6 mg/dL (ref 0.57–1.00)
GFR calc Af Amer: 136 mL/min/{1.73_m2} (ref >59)
GFR calc non Af Amer: 118 mL/min/{1.73_m2} (ref >59)
Globulin, Total: 3 g/dL (ref 1.5–4.5)
Glucose: 91 mg/dL (ref 65–99)
Potassium: 3.9 mmol/L (ref 3.5–5.2)
Sodium: 137 mmol/L (ref 134–144)
Total Protein: 7.4 g/dL (ref 6.0–8.5)

## 2020-07-08 ENCOUNTER — Telehealth: Payer: Self-pay | Admitting: Neurology

## 2020-07-08 NOTE — Telephone Encounter (Signed)
[employer] Berkley Harvey: [id number] & [id number] (exp. 07/07/20 to 08/21/20).. left a voicemail for patient to call me back about scheduling her mri's.

## 2020-07-10 ENCOUNTER — Ambulatory Visit (INDEPENDENT_AMBULATORY_CARE_PROVIDER_SITE_OTHER): Payer: 59

## 2020-07-10 DIAGNOSIS — J309 Allergic rhinitis, unspecified: Secondary | ICD-10-CM | POA: Diagnosis not present

## 2020-07-14 NOTE — Telephone Encounter (Signed)
Patient returned my call she is scheduled at [facility] for 08/06/20.

## 2020-07-16 ENCOUNTER — Ambulatory Visit (INDEPENDENT_AMBULATORY_CARE_PROVIDER_SITE_OTHER): Payer: 59

## 2020-07-16 DIAGNOSIS — J309 Allergic rhinitis, unspecified: Secondary | ICD-10-CM

## 2020-07-21 ENCOUNTER — Ambulatory Visit (INDEPENDENT_AMBULATORY_CARE_PROVIDER_SITE_OTHER): Payer: 59

## 2020-07-21 DIAGNOSIS — J309 Allergic rhinitis, unspecified: Secondary | ICD-10-CM

## 2020-07-29 ENCOUNTER — Ambulatory Visit (INDEPENDENT_AMBULATORY_CARE_PROVIDER_SITE_OTHER): Payer: 59

## 2020-07-29 DIAGNOSIS — J309 Allergic rhinitis, unspecified: Secondary | ICD-10-CM | POA: Diagnosis not present

## 2020-08-04 ENCOUNTER — Ambulatory Visit (INDEPENDENT_AMBULATORY_CARE_PROVIDER_SITE_OTHER): Payer: 59

## 2020-08-04 DIAGNOSIS — J309 Allergic rhinitis, unspecified: Secondary | ICD-10-CM

## 2020-08-06 ENCOUNTER — Other Ambulatory Visit: Payer: Self-pay | Admitting: Neurology

## 2020-08-06 ENCOUNTER — Ambulatory Visit: Payer: 59

## 2020-08-06 DIAGNOSIS — M542 Cervicalgia: Secondary | ICD-10-CM

## 2020-08-06 DIAGNOSIS — R51 Headache with orthostatic component, not elsewhere classified: Secondary | ICD-10-CM

## 2020-08-06 DIAGNOSIS — R519 Headache, unspecified: Secondary | ICD-10-CM

## 2020-08-06 DIAGNOSIS — R531 Weakness: Secondary | ICD-10-CM

## 2020-08-06 DIAGNOSIS — R202 Paresthesia of skin: Secondary | ICD-10-CM

## 2020-08-06 DIAGNOSIS — H5460 Unqualified visual loss, one eye, unspecified: Secondary | ICD-10-CM

## 2020-08-11 ENCOUNTER — Ambulatory Visit (INDEPENDENT_AMBULATORY_CARE_PROVIDER_SITE_OTHER): Payer: 59

## 2020-08-11 DIAGNOSIS — J309 Allergic rhinitis, unspecified: Secondary | ICD-10-CM

## 2020-08-20 ENCOUNTER — Ambulatory Visit (INDEPENDENT_AMBULATORY_CARE_PROVIDER_SITE_OTHER): Payer: 59

## 2020-08-20 DIAGNOSIS — J309 Allergic rhinitis, unspecified: Secondary | ICD-10-CM

## 2020-08-28 ENCOUNTER — Ambulatory Visit (INDEPENDENT_AMBULATORY_CARE_PROVIDER_SITE_OTHER): Payer: 59

## 2020-08-28 DIAGNOSIS — J309 Allergic rhinitis, unspecified: Secondary | ICD-10-CM

## 2020-09-02 ENCOUNTER — Ambulatory Visit (INDEPENDENT_AMBULATORY_CARE_PROVIDER_SITE_OTHER): Payer: 59 | Admitting: *Deleted

## 2020-09-02 DIAGNOSIS — J309 Allergic rhinitis, unspecified: Secondary | ICD-10-CM

## 2020-09-17 ENCOUNTER — Ambulatory Visit (INDEPENDENT_AMBULATORY_CARE_PROVIDER_SITE_OTHER): Payer: 59

## 2020-09-17 DIAGNOSIS — J309 Allergic rhinitis, unspecified: Secondary | ICD-10-CM | POA: Diagnosis not present

## 2020-09-22 ENCOUNTER — Ambulatory Visit (INDEPENDENT_AMBULATORY_CARE_PROVIDER_SITE_OTHER): Payer: 59

## 2020-09-22 DIAGNOSIS — J309 Allergic rhinitis, unspecified: Secondary | ICD-10-CM | POA: Diagnosis not present

## 2020-09-26 DIAGNOSIS — F419 Anxiety disorder, unspecified: Secondary | ICD-10-CM | POA: Insufficient documentation

## 2020-09-29 ENCOUNTER — Other Ambulatory Visit: Payer: Self-pay

## 2020-09-29 ENCOUNTER — Ambulatory Visit (INDEPENDENT_AMBULATORY_CARE_PROVIDER_SITE_OTHER): Payer: 59

## 2020-09-29 ENCOUNTER — Ambulatory Visit (INDEPENDENT_AMBULATORY_CARE_PROVIDER_SITE_OTHER): Payer: 59 | Admitting: Medical-Surgical

## 2020-09-29 ENCOUNTER — Encounter: Payer: Self-pay | Admitting: Medical-Surgical

## 2020-09-29 VITALS — BP 124/81 | HR 95 | Temp 98.9°F | Ht 62.0 in | Wt 158.4 lb

## 2020-09-29 DIAGNOSIS — M79652 Pain in left thigh: Secondary | ICD-10-CM

## 2020-09-29 DIAGNOSIS — Z7689 Persons encountering health services in other specified circumstances: Secondary | ICD-10-CM | POA: Diagnosis not present

## 2020-09-29 DIAGNOSIS — F419 Anxiety disorder, unspecified: Secondary | ICD-10-CM | POA: Diagnosis not present

## 2020-09-29 DIAGNOSIS — J4520 Mild intermittent asthma, uncomplicated: Secondary | ICD-10-CM

## 2020-09-29 DIAGNOSIS — J3089 Other allergic rhinitis: Secondary | ICD-10-CM

## 2020-09-29 NOTE — Progress Notes (Signed)
New Patient Office Visit  Subjective:  Patient ID: Stacey Savage, female    DOB: [birth year]  Age: 40 y.o. MRN: [id number]  CC:  Chief Complaint  Patient presents with  . Establish Care    HPI Stacey Savage presents to establish care.   Allergies: Has severe allergies, especially to cats.  Takes montelukast and has been doing allergy shots to help with this.  Asthma: Prescribed an albuterol inhaler which he uses intermittently.  Also prescribed Symbicort which she sometimes forgets.  As long as she keeps her allergies in good control, she does not have many problems with asthma.  Anxiety: Longstanding history of anxiety.  Has tried medications previously but does not tolerate them very well.  Notes that she did try Zoloft before but was unfortunately unable to eat or drink anything due to severe nausea.  She ended up being sent to the emergency room for dehydration and so stopped taking the medication.  She is currently breast-feeding and is very hesitant to start any other medications.  Her most recent prescription was sent at the end of January for Lexapro but she admits she has not started this yet.  Has had aches and pains for the last year.  Notes these started after having an upper respiratory illness.  She did test for Covid and was negative at that time.  She is continued to feel fatigued but has started gradually getting better.  Her daughter unfortunately caught Covid last month and with this, she has noted a worsening of her aches and pains.  Of most concern today, her left anterior and lateral thighs are very painful.  She notes that there are knots under the skin that are tender to touch and usually end up bruising.  She is not sure what these are from but notes that she also has random lumps under her skin on both of her forearms as well as her back. Has been very concern about what these lumps are from.   Past Medical History:  Diagnosis Date  . Allergic rhinitis   .  Anxiety   . Asthma   . Vitamin D deficiency     Past Surgical History:  Procedure Laterality Date  . NO PAST SURGERIES      Family History  Problem Relation Age of Onset  . Hypertension Father   . Diabetes Father   . Heart disease Father   . High Cholesterol Father   . Anxiety disorder Father   . Allergic rhinitis Sister   . Asthma Sister   . Diabetes Maternal Grandmother   . Angioedema Neg Hx   . Eczema Neg Hx   . Immunodeficiency Neg Hx   . Urticaria Neg Hx     Social History   Socioeconomic History  . Marital status: Single    Spouse name: Not on file  . Number of children: 1  . Years of education: Not on file  . Highest education level: Not on file  Occupational History  . Not on file  Tobacco Use  . Smoking status: Never Smoker  . Smokeless tobacco: Never Used  Vaping Use  . Vaping Use: Never used  Substance and Sexual Activity  . Alcohol use: Yes    Comment: Occasionally  . Drug use: Not Currently    Types: Marijuana  . Sexual activity: Yes    Partners: Male    Birth control/protection: None, Coitus interruptus  Other Topics Concern  . Not on file  Social History Narrative   Lives  at home with boyfriend and daughter   Right handed   Caffeine: 1-4 times a week   Social Determinants of Health   Financial Resource Strain: Not on file  Food Insecurity: Not on file  Transportation Needs: Not on file  Physical Activity: Not on file  Stress: Not on file  Social Connections: Not on file  Intimate Partner Violence: Not on file    ROS Review of Systems  Constitutional: Positive for fatigue. Negative for chills, fever and unexpected weight change.  Eyes: Positive for visual disturbance (With migraines).  Respiratory: Negative for cough, chest tightness, shortness of breath and wheezing.   Cardiovascular: Negative for chest pain, palpitations and leg swelling.  Musculoskeletal: Positive for arthralgias, back pain and myalgias.  Neurological:  Positive for headaches. Negative for dizziness and light-headedness.  Psychiatric/Behavioral: Negative for dysphoric mood, self-injury and suicidal ideas. The patient is nervous/anxious.     Objective:   Today's Vitals: BP 124/81   Pulse 95   Temp 98.9 F (37.2 C)   Ht 5\' 2"  (1.575 m)   Wt 158 lb 6.4 oz (71.8 kg)   LMP 09/24/2020   SpO2 96%   BMI 28.97 kg/m   Physical Exam Vitals reviewed.  Constitutional:      General: She is not in acute distress.    Appearance: Normal appearance.  HENT:     Head: Normocephalic and atraumatic.  Cardiovascular:     Rate and Rhythm: Normal rate and regular rhythm.     Pulses: Normal pulses.     Heart sounds: Normal heart sounds. No murmur heard. No friction rub. No gallop.   Pulmonary:     Effort: Pulmonary effort is normal. No respiratory distress.     Breath sounds: Normal breath sounds. No wheezing.  Skin:    General: Skin is warm and dry.     Findings: Lesion (Subcutaneous nodules along the anterior and lateral left thigh, left lateral thoracic spine and midline thoracic spine) present.  Neurological:     Mental Status: She is alert and oriented to person, place, and time.  Psychiatric:        Mood and Affect: Mood normal.        Behavior: Behavior normal.        Thought Content: Thought content normal.        Judgment: Judgment normal.     Assessment & Plan:   1. Encounter to establish care Reviewed available information and discussed health care concerns with patient.  2. Left thigh pain Given her family history of early onset bone cancer in her current left thigh pain.  We will go ahead and get x-rays of the left femur.  Unclear etiology of painful knot under the skin. - DG FEMUR MIN 2 VIEWS LEFT; Future  3. Mild intermittent asthma without complication Recommend continuing Symbicort with as needed use of albuterol.  4. Anxiety Discussed medication options.  She is understandably hesitant to try a different medication  at this point.  Discussed GeneSight testing as this may be something that is helpful for her.  She would like to proceed with ordering the test to see if her insurance will cover the cost or if it is an affordable option for her.  5. Allergic rhinitis Continue montelukast.  Followed by allergy.  Outpatient Encounter Medications as of 09/29/2020  Medication Sig  . Acetaminophen (TYLENOL PO) Take 500 mg by mouth as needed.  11/27/2020 b complex vitamins capsule Take 1 capsule by mouth daily.  Marland Kitchen BIOTIN  PO Take by mouth.  . budesonide-formoterol (SYMBICORT) 160-4.5 MCG/ACT inhaler 2 puffs twice daily with spacer to prevent coughing or wheezing. Rinse,gargle and spit after use.  . cetirizine (ZYRTEC) 10 MG tablet Take 1 tablet (10 mg total) by mouth daily as needed for allergies.  . Cholecalciferol (VITAMIN D3) 125 MCG (5000 UT) TABS Take 2 each by mouth daily.   Marland Kitchen escitalopram (LEXAPRO) 10 MG tablet Take 10 mg by mouth daily.  . fluticasone (FLONASE) 50 MCG/ACT nasal spray Place 2 sprays into both nostrils daily as needed.  . hydrOXYzine (ATARAX/VISTARIL) 25 MG tablet Take 1 tablet (25 mg total) by mouth every 6 (six) hours as needed for anxiety or itching (appetite).  . montelukast (SINGULAIR) 10 MG tablet Take 1 tablet (10 mg total) by mouth at bedtime.  . naproxen (NAPROSYN) 500 MG tablet Take 500 mg by mouth 2 (two) times daily.  . pantoprazole (PROTONIX) 20 MG tablet Take 20 mg by mouth daily.  . Prenatal Vit-Fe Fumarate-FA (PRENATAL VITAMIN PO) Take by mouth.  Marland Kitchen PRESCRIPTION MEDICATION once a week. Allergy shots  . Turmeric (QC TUMERIC COMPLEX PO) Take 2 tablets by mouth daily.   Marland Kitchen UNABLE TO FIND Med Name: CBD  . VENTOLIN HFA 108 (90 Base) MCG/ACT inhaler INHALE 2 PUFFS INTO THE LUNGS EVERY 4 (FOUR) HOURS AS NEEDED FOR WHEEZING OR SHORTNESS OF BREATH.  . [DISCONTINUED] EPINEPHrine (AUVI-Q) 0.3 mg/0.3 mL IJ SOAJ injection Use as directed for severe allergic reactions  . [DISCONTINUED] fluocinonide  (LIDEX) 0.05 % external solution   . [DISCONTINUED] ketoconazole (NIZORAL) 2 % shampoo Apply 1 application topically 2 (two) times a week.   No facility-administered encounter medications on file as of 09/29/2020.    Follow-up: Return if symptoms worsen or fail to improve.   Thayer Ohm, DNP, APRN, FNP-BC [facility]

## 2020-10-03 ENCOUNTER — Ambulatory Visit (INDEPENDENT_AMBULATORY_CARE_PROVIDER_SITE_OTHER): Payer: 59

## 2020-10-03 DIAGNOSIS — J309 Allergic rhinitis, unspecified: Secondary | ICD-10-CM

## 2020-10-04 ENCOUNTER — Encounter: Payer: Self-pay | Admitting: Medical-Surgical

## 2020-10-06 ENCOUNTER — Ambulatory Visit (INDEPENDENT_AMBULATORY_CARE_PROVIDER_SITE_OTHER): Payer: 59

## 2020-10-06 DIAGNOSIS — J309 Allergic rhinitis, unspecified: Secondary | ICD-10-CM

## 2020-10-07 ENCOUNTER — Other Ambulatory Visit: Payer: Self-pay

## 2020-10-07 ENCOUNTER — Ambulatory Visit (INDEPENDENT_AMBULATORY_CARE_PROVIDER_SITE_OTHER): Payer: 59 | Admitting: Medical-Surgical

## 2020-10-07 VITALS — BP 108/70 | HR 76 | Temp 98.1°F | Ht 62.0 in | Wt 157.1 lb

## 2020-10-07 DIAGNOSIS — M542 Cervicalgia: Secondary | ICD-10-CM

## 2020-10-07 NOTE — Progress Notes (Signed)
Subjective:    CC: neck pain  HPI: Pleasant 40 year old female presenting today for evaluation of right lateral/posterior neck pain.  Noticed the pain last week on Friday evening.  Originally started out as pain but notes it felt hot on the inside but was not warm to touch.  Later that night, she noted that the area of pain felt itchy as if she had a rash.  She did take Aleve once but this was not helpful.  She has had no numbness and tingling in her upper extremities.  No limitation in range of motion of neck or severe pain with flexion/extension/rotation.  She did go to a chiropractor who was unable to pinpoint the issue and notes that it may be a muscle strain.  Since Friday, she notes that the discomfort has improved but has not resolved. Wonders if it might be in relation to her strong coughing recently.   I reviewed the past medical history, family history, social history, surgical history, and allergies today and no changes were needed.  Please see the problem list section below in epic for further details.  Past Medical History: Past Medical History:  Diagnosis Date  . Allergic rhinitis   . Anxiety   . Asthma   . Vitamin D deficiency    Past Surgical History: Past Surgical History:  Procedure Laterality Date  . NO PAST SURGERIES     Social History: Social History   Socioeconomic History  . Marital status: Single    Spouse name: Not on file  . Number of children: 1  . Years of education: Not on file  . Highest education level: Not on file  Occupational History  . Not on file  Tobacco Use  . Smoking status: Never Smoker  . Smokeless tobacco: Never Used  Vaping Use  . Vaping Use: Never used  Substance and Sexual Activity  . Alcohol use: Yes    Comment: Occasionally  . Drug use: Not Currently    Types: Marijuana  . Sexual activity: Yes    Partners: Male    Birth control/protection: None, Coitus interruptus  Other Topics Concern  . Not on file  Social History  Narrative   Lives at home with boyfriend and daughter   Right handed   Caffeine: 1-4 times a week   Social Determinants of Health   Financial Resource Strain: Not on file  Food Insecurity: Not on file  Transportation Needs: Not on file  Physical Activity: Not on file  Stress: Not on file  Social Connections: Not on file   Family History: Family History  Problem Relation Age of Onset  . Hypertension Father   . Diabetes Father   . Heart disease Father   . High Cholesterol Father   . Anxiety disorder Father   . Allergic rhinitis Sister   . Asthma Sister   . Diabetes Maternal Grandmother   . Angioedema Neg Hx   . Eczema Neg Hx   . Immunodeficiency Neg Hx   . Urticaria Neg Hx    Allergies: No Known Allergies Medications: See med rec.  Review of Systems: See HPI for pertinent positives and negatives.   Objective:    General: Well Developed, well nourished, and in no acute distress.  Neuro: Alert and oriented x3.  HEENT: Normocephalic, atraumatic.  Skin: Warm and dry. Cardiac: Regular rate and rhythm, no murmurs rubs or gallops, no lower extremity edema.  Respiratory: Clear to auscultation bilaterally. Not using accessory muscles, speaking in full sentences.  Impression and  Recommendations:    1. Cervicalgia Area of concern is the insertion point of the SCM. Suspect muscle strain/inflammation. Recommend Ibuprofen 600mg  every 6 hours (or 800mg  every 8 hours if tolerated) for the next few days. Can use heat/ice, stretching, voltaren gel. If no resolution in 2-4 weeks of conservative measures, will need further evaluation.   Return if symptoms worsen or fail to improve. ___________________________________________ , DNP, APRN, FNP-BC [facility]

## 2020-10-14 ENCOUNTER — Ambulatory Visit (INDEPENDENT_AMBULATORY_CARE_PROVIDER_SITE_OTHER): Payer: 59

## 2020-10-14 DIAGNOSIS — J309 Allergic rhinitis, unspecified: Secondary | ICD-10-CM | POA: Diagnosis not present

## 2020-10-21 ENCOUNTER — Ambulatory Visit (INDEPENDENT_AMBULATORY_CARE_PROVIDER_SITE_OTHER): Payer: 59

## 2020-10-21 DIAGNOSIS — J309 Allergic rhinitis, unspecified: Secondary | ICD-10-CM | POA: Diagnosis not present

## 2020-10-28 ENCOUNTER — Ambulatory Visit (INDEPENDENT_AMBULATORY_CARE_PROVIDER_SITE_OTHER): Payer: 59

## 2020-10-28 DIAGNOSIS — J309 Allergic rhinitis, unspecified: Secondary | ICD-10-CM

## 2020-11-03 ENCOUNTER — Ambulatory Visit (INDEPENDENT_AMBULATORY_CARE_PROVIDER_SITE_OTHER): Payer: 59

## 2020-11-03 DIAGNOSIS — J309 Allergic rhinitis, unspecified: Secondary | ICD-10-CM

## 2020-11-10 ENCOUNTER — Encounter: Payer: Self-pay | Admitting: Medical-Surgical

## 2020-11-13 ENCOUNTER — Ambulatory Visit: Payer: Self-pay

## 2020-11-14 ENCOUNTER — Ambulatory Visit (INDEPENDENT_AMBULATORY_CARE_PROVIDER_SITE_OTHER): Payer: 59

## 2020-11-14 DIAGNOSIS — J309 Allergic rhinitis, unspecified: Secondary | ICD-10-CM

## 2020-11-18 ENCOUNTER — Ambulatory Visit (INDEPENDENT_AMBULATORY_CARE_PROVIDER_SITE_OTHER): Payer: 59

## 2020-11-18 DIAGNOSIS — J309 Allergic rhinitis, unspecified: Secondary | ICD-10-CM

## 2020-11-28 ENCOUNTER — Ambulatory Visit: Payer: 59 | Admitting: Medical-Surgical

## 2020-11-28 ENCOUNTER — Ambulatory Visit (INDEPENDENT_AMBULATORY_CARE_PROVIDER_SITE_OTHER): Payer: 59

## 2020-11-28 ENCOUNTER — Other Ambulatory Visit: Payer: Self-pay

## 2020-11-28 ENCOUNTER — Encounter: Payer: Self-pay | Admitting: Medical-Surgical

## 2020-11-28 VITALS — BP 114/72 | HR 76 | Temp 98.4°F | Ht 62.0 in | Wt 160.0 lb

## 2020-11-28 DIAGNOSIS — R109 Unspecified abdominal pain: Secondary | ICD-10-CM | POA: Diagnosis not present

## 2020-11-28 DIAGNOSIS — M549 Dorsalgia, unspecified: Secondary | ICD-10-CM | POA: Insufficient documentation

## 2020-11-28 LAB — POCT URINALYSIS DIP (CLINITEK)
Bilirubin, UA: NEGATIVE
Blood, UA: NEGATIVE
Glucose, UA: NEGATIVE mg/dL
Ketones, POC UA: NEGATIVE mg/dL
Leukocytes, UA: NEGATIVE
Nitrite, UA: NEGATIVE
POC PROTEIN,UA: NEGATIVE
Spec Grav, UA: 1.02 (ref 1.010–1.025)
Urobilinogen, UA: 0.2 E.U./dL
pH, UA: 7 (ref 5.0–8.0)

## 2020-11-28 NOTE — Progress Notes (Signed)
Subjective:    CC: flank pain  HPI: Pleasant 40 year old female presenting for evaluation of flank pain. Had left sided flank pain last week that got better but is still there off and on. Now having right flank pain as well. Pain is constant, dull but at times wakes her with sharp/stabbing. Going on 5 days of the right side hurting. No urinary symptoms or hematuria. No history of kidney stones. Was nauseated and had some loose stools earlier this week. Drinks plenty of water. Some discomfort in her left axilla this week.   I reviewed the past medical history, family history, social history, surgical history, and allergies today and no changes were needed.  Please see the problem list section below in epic for further details.  Past Medical History: Past Medical History:  Diagnosis Date  . Allergic rhinitis   . Anxiety   . Asthma   . Vitamin D deficiency    Past Surgical History: Past Surgical History:  Procedure Laterality Date  . NO PAST SURGERIES     Social History: Social History   Socioeconomic History  . Marital status: Single    Spouse name: Not on file  . Number of children: 1  . Years of education: Not on file  . Highest education level: Not on file  Occupational History  . Not on file  Tobacco Use  . Smoking status: Never Smoker  . Smokeless tobacco: Never Used  Vaping Use  . Vaping Use: Never used  Substance and Sexual Activity  . Alcohol use: Yes    Comment: Occasionally  . Drug use: Not Currently    Types: Marijuana  . Sexual activity: Yes    Partners: Male    Birth control/protection: None, Coitus interruptus  Other Topics Concern  . Not on file  Social History Narrative   Lives at home with boyfriend and daughter   Right handed   Caffeine: 1-4 times a week   Social Determinants of Health   Financial Resource Strain: Not on file  Food Insecurity: Not on file  Transportation Needs: Not on file  Physical Activity: Not on file  Stress: Not on  file  Social Connections: Not on file   Family History: Family History  Problem Relation Age of Onset  . Hypertension Father   . Diabetes Father   . Heart disease Father   . High Cholesterol Father   . Anxiety disorder Father   . Allergic rhinitis Sister   . Asthma Sister   . Diabetes Maternal Grandmother   . Angioedema Neg Hx   . Eczema Neg Hx   . Immunodeficiency Neg Hx   . Urticaria Neg Hx    Allergies: No Known Allergies Medications: See med rec.  Review of Systems: See HPI for pertinent positives and negatives.   Objective:    General: Well Developed, well nourished, and in no acute distress.  Neuro: Alert and oriented x3, extra-ocular muscles intact, sensation grossly intact.  HEENT: Normocephalic, atraumatic, pupils equal round reactive to light, neck supple, no masses, no lymphadenopathy, thyroid nonpalpable.  Skin: Warm and dry, no rashes. Cardiac: Regular rate and rhythm, no murmurs rubs or gallops, no lower extremity edema.  Respiratory: Clear to auscultation bilaterally. Not using accessory muscles, speaking in full sentences.  Impression and Recommendations:    1. Bilateral flank pain POCT urinalysis negative and no hematuria.  Low suspicion for kidney stones at this point but will get a KUB for further evaluation..  More suspicious for musculoskeletal etiology.  Since  she is breast-feeding, no medicines sent today but advised to use Tylenol/ibuprofen, heat, ice, massage, and stretching over the weekend.  Advised to increase water intake. - POCT URINALYSIS DIP (CLINITEK) - DG Abd 1 View; Future  Return if symptoms worsen or fail to improve. ___________________________________________ Thayer Ohm, DNP, APRN, FNP-BC Primary Care and Sports Medicine [facility] [place]

## 2020-12-01 ENCOUNTER — Encounter: Payer: Self-pay | Admitting: Medical-Surgical

## 2020-12-02 ENCOUNTER — Ambulatory Visit (INDEPENDENT_AMBULATORY_CARE_PROVIDER_SITE_OTHER): Payer: 59

## 2020-12-02 ENCOUNTER — Ambulatory Visit: Payer: Self-pay

## 2020-12-02 DIAGNOSIS — J309 Allergic rhinitis, unspecified: Secondary | ICD-10-CM | POA: Diagnosis not present

## 2020-12-04 ENCOUNTER — Other Ambulatory Visit: Payer: Self-pay | Admitting: *Deleted

## 2020-12-04 MED ORDER — MONTELUKAST SODIUM 10 MG PO TABS: ORAL_TABLET | ORAL | 0 refills | Status: DC

## 2020-12-08 ENCOUNTER — Other Ambulatory Visit: Payer: Self-pay | Admitting: *Deleted

## 2020-12-08 MED ORDER — CETIRIZINE HCL 10 MG PO TABS: 10.0000 mg | ORAL_TABLET | Freq: Every day | ORAL | 0 refills | Status: DC | PRN

## 2020-12-10 ENCOUNTER — Ambulatory Visit (INDEPENDENT_AMBULATORY_CARE_PROVIDER_SITE_OTHER): Payer: 59

## 2020-12-10 DIAGNOSIS — J309 Allergic rhinitis, unspecified: Secondary | ICD-10-CM

## 2020-12-18 ENCOUNTER — Ambulatory Visit (INDEPENDENT_AMBULATORY_CARE_PROVIDER_SITE_OTHER): Payer: 59

## 2020-12-18 DIAGNOSIS — J309 Allergic rhinitis, unspecified: Secondary | ICD-10-CM

## 2020-12-24 ENCOUNTER — Ambulatory Visit (INDEPENDENT_AMBULATORY_CARE_PROVIDER_SITE_OTHER): Payer: 59

## 2020-12-24 DIAGNOSIS — J309 Allergic rhinitis, unspecified: Secondary | ICD-10-CM | POA: Diagnosis not present

## 2020-12-31 ENCOUNTER — Ambulatory Visit (INDEPENDENT_AMBULATORY_CARE_PROVIDER_SITE_OTHER): Payer: 59

## 2020-12-31 DIAGNOSIS — J309 Allergic rhinitis, unspecified: Secondary | ICD-10-CM

## 2021-01-06 ENCOUNTER — Ambulatory Visit (INDEPENDENT_AMBULATORY_CARE_PROVIDER_SITE_OTHER): Payer: 59

## 2021-01-06 DIAGNOSIS — J309 Allergic rhinitis, unspecified: Secondary | ICD-10-CM

## 2021-01-16 ENCOUNTER — Ambulatory Visit (INDEPENDENT_AMBULATORY_CARE_PROVIDER_SITE_OTHER): Payer: 59 | Admitting: *Deleted

## 2021-01-16 DIAGNOSIS — J309 Allergic rhinitis, unspecified: Secondary | ICD-10-CM | POA: Diagnosis not present

## 2021-01-22 ENCOUNTER — Ambulatory Visit (INDEPENDENT_AMBULATORY_CARE_PROVIDER_SITE_OTHER): Payer: 59

## 2021-01-22 DIAGNOSIS — J309 Allergic rhinitis, unspecified: Secondary | ICD-10-CM | POA: Diagnosis not present

## 2021-01-26 ENCOUNTER — Encounter: Payer: Self-pay | Admitting: Medical-Surgical

## 2021-01-28 ENCOUNTER — Ambulatory Visit (INDEPENDENT_AMBULATORY_CARE_PROVIDER_SITE_OTHER): Payer: 59

## 2021-01-28 DIAGNOSIS — J309 Allergic rhinitis, unspecified: Secondary | ICD-10-CM

## 2021-02-05 ENCOUNTER — Ambulatory Visit (INDEPENDENT_AMBULATORY_CARE_PROVIDER_SITE_OTHER): Payer: 59

## 2021-02-05 DIAGNOSIS — J309 Allergic rhinitis, unspecified: Secondary | ICD-10-CM

## 2021-02-11 ENCOUNTER — Encounter: Payer: Self-pay | Admitting: Medical-Surgical

## 2021-02-11 ENCOUNTER — Other Ambulatory Visit: Payer: Self-pay

## 2021-02-11 ENCOUNTER — Ambulatory Visit: Payer: 59 | Admitting: Medical-Surgical

## 2021-02-11 ENCOUNTER — Ambulatory Visit (INDEPENDENT_AMBULATORY_CARE_PROVIDER_SITE_OTHER): Payer: 59

## 2021-02-11 VITALS — BP 123/73 | HR 74 | Temp 98.6°F | Ht 62.0 in | Wt 158.0 lb

## 2021-02-11 DIAGNOSIS — R4189 Other symptoms and signs involving cognitive functions and awareness: Secondary | ICD-10-CM

## 2021-02-11 DIAGNOSIS — M791 Myalgia, unspecified site: Secondary | ICD-10-CM

## 2021-02-11 DIAGNOSIS — M5442 Lumbago with sciatica, left side: Secondary | ICD-10-CM

## 2021-02-11 DIAGNOSIS — R202 Paresthesia of skin: Secondary | ICD-10-CM | POA: Diagnosis not present

## 2021-02-11 DIAGNOSIS — M79652 Pain in left thigh: Secondary | ICD-10-CM | POA: Diagnosis not present

## 2021-02-11 DIAGNOSIS — G8929 Other chronic pain: Secondary | ICD-10-CM

## 2021-02-11 NOTE — Progress Notes (Signed)
Subjective:    CC: leg pain, hand/arm pain/numbness   HPI: Stacey Savage 40 year old female presenting today for evaluation of the following:  Leg pain- has been having sharp, shooting pains along the anterior and lateral left thigh intermittently for several weeks to months. Certain positions cause the pains to start at the knee and shoot up to her hip. She does have a history of low back pain but this pain is different from that. Notes that she has small knots in her left thigh that appears to be where the pain originates from. Some muscle tenderness along the anterior left thigh. No numbness, tingling, or shooting pain going down the left leg. No saddle paresthesias, weakness, or incontinence. No recent or remote injury.   Left arm/hand- has noted numbness/tingling to the fourth and fifth fingertip/nail of the left hand occurring intermittently. Sensations move up the arm at times and she has noted pains that shoot up the arm toward the elbow or higher. No identifiable activity or injury to cause her symptoms. She does work at a desk with a computer most days. She is right handed and may lean on her left elbow at times. Also has hard knots in her left forearm similar to her leg. The knots are palpable but not tender. Notes her pains seem to come from those spots.   Has had some fatigue and brain fog lately that are very bothersome. She had recent imaging (03/2020) done with no abnormalities identified. She got worried when her symptoms kept occurring on the left side only and she had recurring brain fog. Also notes that when she eats a sugary meal or snack, she feels awful afterward and thinks that her brain fog increases. Has diabetes in several family members and wonders if that may be an issue for her.   I reviewed the past medical history, family history, social history, surgical history, and allergies today and no changes were needed.  Please see the problem list section below in epic for further  details.  Past Medical History: Past Medical History:  Diagnosis Date   Allergic rhinitis    Anxiety    Asthma    Vitamin D deficiency    Past Surgical History: Past Surgical History:  Procedure Laterality Date   NO PAST SURGERIES     Social History: Social History   Socioeconomic History   Marital status: Single    Spouse name: Not on file   Number of children: 1   Years of education: Not on file   Highest education level: Not on file  Occupational History   Not on file  Tobacco Use   Smoking status: Never   Smokeless tobacco: Never  Vaping Use   Vaping Use: Never used  Substance and Sexual Activity   Alcohol use: Yes    Comment: Occasionally   Drug use: Not Currently    Types: Marijuana   Sexual activity: Yes    Partners: Male    Birth control/protection: None, Coitus interruptus  Other Topics Concern   Not on file  Social History Narrative   Lives at home with boyfriend and daughter   Right handed   Caffeine: 1-4 times a week   Social Determinants of Health   Financial Resource Strain: Not on file  Food Insecurity: Not on file  Transportation Needs: Not on file  Physical Activity: Not on file  Stress: Not on file  Social Connections: Not on file   Family History: Family History  Problem Relation Age of Onset  Hypertension Father    Diabetes Father    Heart disease Father    High Cholesterol Father    Anxiety disorder Father    Allergic rhinitis Sister    Asthma Sister    Diabetes Maternal Grandmother    Angioedema Neg Hx    Eczema Neg Hx    Immunodeficiency Neg Hx    Urticaria Neg Hx    Allergies: No Known Allergies Medications: See med rec.  Review of Systems: See HPI for pertinent positives and negatives.   Objective:    General: Well Developed, well nourished, and in no acute distress.  Neuro: Alert and oriented x3.  HEENT: Normocephalic, atraumatic.  Skin: Warm and dry. Cardiac: Regular rate and rhythm, no murmurs rubs or  gallops, no lower extremity edema.  Respiratory: Clear to auscultation bilaterally. Not using accessory muscles, speaking in full sentences.  Impression and Recommendations:    1. Chronic bilateral low back pain with left-sided sciatica Getting updated lumbar spine x-rays. Possible connection with left thigh pain but symptoms are not consistent with sciatica.  - DG Lumbar Spine Complete; Future  2. Muscle tenderness 3. Left thigh pain Checking CBC, CMP, ESR, CRP, and CK. Knots in thighs suspicious for trigger points. Discussed the possibility of a myofascial pain syndrome as a cause but checking blood work to rule out other issues. Previously very active but has let that slide since having her baby. Recommend resuming regular physical activity with a focus on stretching and strength exercises. Consider massage to the affected areas. May also benefit from heat/ice or lidocaine patches as needed. Ok to use Tylenol/Ibuprofen if desired.  - CBC - CK (Creatine Kinase) - COMPLETE METABOLIC PANEL WITH GFR - Sed Rate (ESR) - C-reactive protein  3. Left hand paresthesia Suspect trigger points causing pain but paresthesias in the hand seem more consistent with ulnar nerve compression/irritation. Likely caused by repeated pressure during her workday. Advised patient to be more aware of her body position during the day to identify possible positional contributors to her symptoms. Recommend a stand up desk if feasible to help prevent leaning and help correct posture.   4. Brain fog Unclear etiology. Checking labs below.  - CBC - CK (Creatine Kinase) - COMPLETE METABOLIC PANEL WITH GFR - Sed Rate (ESR) - C-reactive protein - Hemoglobin A1c  Return if symptoms worsen or fail to improve. ___________________________________________ Clearnce Sorrel, DNP, APRN, FNP-BC Primary Care and [place]

## 2021-02-12 ENCOUNTER — Ambulatory Visit (INDEPENDENT_AMBULATORY_CARE_PROVIDER_SITE_OTHER): Payer: 59

## 2021-02-12 DIAGNOSIS — J309 Allergic rhinitis, unspecified: Secondary | ICD-10-CM | POA: Diagnosis not present

## 2021-02-12 LAB — CBC
HCT: 38.2 % (ref 35.0–45.0)
Hemoglobin: 12.3 g/dL (ref 11.7–15.5)
MCH: 27.9 pg (ref 27.0–33.0)
MCHC: 32.2 g/dL (ref 32.0–36.0)
MCV: 86.6 fL (ref 80.0–100.0)
MPV: 10.5 fL (ref 7.5–12.5)
Platelets: 297 10*3/uL (ref 140–400)
RBC: 4.41 10*6/uL (ref 3.80–5.10)
RDW: 12.3 % (ref 11.0–15.0)
WBC: 8.4 10*3/uL (ref 3.8–10.8)

## 2021-02-12 LAB — COMPLETE METABOLIC PANEL WITH GFR
AG Ratio: 1.3 (calc) (ref 1.0–2.5)
ALT: 15 U/L (ref 6–29)
AST: 17 U/L (ref 10–30)
Albumin: 3.8 g/dL (ref 3.6–5.1)
Alkaline phosphatase (APISO): 73 U/L (ref 31–125)
BUN/Creatinine Ratio: 15 (calc) (ref 6–22)
BUN: 7 mg/dL (ref 7–25)
CO2: 27 mmol/L (ref 20–32)
Calcium: 9.2 mg/dL (ref 8.6–10.2)
Chloride: 104 mmol/L (ref 98–110)
Creat: 0.48 mg/dL — ABNORMAL LOW (ref 0.50–1.10)
GFR, Est African American: 145 mL/min/{1.73_m2} (ref >=60)
GFR, Est Non African American: 125 mL/min/{1.73_m2} (ref >=60)
Globulin: 2.9 g/dL (calc) (ref 1.9–3.7)
Glucose, Bld: 91 mg/dL (ref 65–99)
Potassium: 3.9 mmol/L (ref 3.5–5.3)
Sodium: 139 mmol/L (ref 135–146)
Total Bilirubin: 0.3 mg/dL (ref 0.2–1.2)
Total Protein: 6.7 g/dL (ref 6.1–8.1)

## 2021-02-12 LAB — C-REACTIVE PROTEIN: CRP: 3.3 mg/L (ref <8.0)

## 2021-02-12 LAB — SEDIMENTATION RATE: Sed Rate: 6 mm/h (ref 0–20)

## 2021-02-12 LAB — CK: Total CK: 29 U/L (ref 29–143)

## 2021-02-12 LAB — HEMOGLOBIN A1C
Hgb A1c MFr Bld: 5.1 % of total Hgb (ref <5.7)
Mean Plasma Glucose: 100 mg/dL
eAG (mmol/L): 5.5 mmol/L

## 2021-02-13 ENCOUNTER — Encounter: Payer: Self-pay | Admitting: Medical-Surgical

## 2021-02-15 ENCOUNTER — Other Ambulatory Visit: Payer: Self-pay | Admitting: Allergy

## 2021-02-16 NOTE — Telephone Encounter (Signed)
Patient was last seen for an office visit September of 2021. She was due back for an office visit 09/07/2020. She was here last week 02/12/21 for her immunotherapy shot.

## 2021-02-17 ENCOUNTER — Ambulatory Visit (INDEPENDENT_AMBULATORY_CARE_PROVIDER_SITE_OTHER): Payer: 59

## 2021-02-17 DIAGNOSIS — J309 Allergic rhinitis, unspecified: Secondary | ICD-10-CM

## 2021-02-18 ENCOUNTER — Encounter: Payer: Self-pay | Admitting: Medical-Surgical

## 2021-02-20 ENCOUNTER — Encounter: Payer: Self-pay | Admitting: Medical-Surgical

## 2021-02-20 ENCOUNTER — Other Ambulatory Visit: Payer: Self-pay

## 2021-02-20 ENCOUNTER — Ambulatory Visit: Payer: 59 | Admitting: Medical-Surgical

## 2021-02-20 VITALS — BP 118/74 | HR 75 | Temp 98.4°F | Ht 62.0 in | Wt 158.0 lb

## 2021-02-20 DIAGNOSIS — R251 Tremor, unspecified: Secondary | ICD-10-CM | POA: Diagnosis not present

## 2021-02-20 DIAGNOSIS — M79601 Pain in right arm: Secondary | ICD-10-CM

## 2021-02-20 DIAGNOSIS — E7889 Other lipoprotein metabolism disorders: Secondary | ICD-10-CM | POA: Diagnosis not present

## 2021-02-20 DIAGNOSIS — E882 Lipomatosis, not elsewhere classified: Secondary | ICD-10-CM | POA: Insufficient documentation

## 2021-02-20 DIAGNOSIS — R531 Weakness: Secondary | ICD-10-CM | POA: Diagnosis not present

## 2021-02-20 MED ORDER — DICLOFENAC SODIUM 2 % EX SOLN: 2.0000 | Freq: Two times a day (BID) | CUTANEOUS | 0 refills | Status: AC

## 2021-02-20 NOTE — Assessment & Plan Note (Signed)
This is a pleasant 40 year old female, she is noted multiple nodules under her skin, arms, thighs, back. She does endorse that is somewhat tender. On exam she has several well-defined, movable subcutaneous lesions consistent with lipomas. Really not tender to palpation. I have really discouraged her from surgical excision of these lesions though I will do it if she really wants me to. We will start with topical Pennsaid (diclofenac 2% topical), she tells [place] that ibuprofen makes her feel drunk. Return to see me as needed.

## 2021-02-20 NOTE — Progress Notes (Signed)
Subjective:    CC: tremors, arm weakness  HPI: Pleasant 40 year old female presenting for evaluation of right arm weakness with soreness of the wrist, forearm, and upper arm. This started a few days ago and has been intermittent. Soreness has gotten better but is still rated at 2/10. Weakness most notable on the first day when she felt as if the arm was very heavy and did not want to move as it usually does. This resolved spontaneously and is not present today. Hand tremor noted while she was in the kitchen helping with cooking and talking with her family. No dropping things or difficulty with hand use/mobility. Has had what felt like numbness to the middle finger but decided it wasn't actually numb. Also had a sharp pain along the dorsal right hand that went up the wrist into the forearm with sharp dorsiflexion of the wrist on one day.   Has mentioned the nodules that she has located on her arms and thighs on several occasions. Feels that the nodules on her arm were where her pain was located over the past few days. Is very concerned that they may be something serious that have not been diagnosed yet. Admits that she has a lot of health related anxiety and often jumps to conclusions of serious illness with even small, temporary symptoms. Feels this is related to multiple family members being diagnosed with terminal illnesses and/or passing away from severe chronic conditions. Does not feel that the hydroxyzine is helpful because even 12.5mg  is too sedating. She is currently breastfeeding so is limited in what anxiety treatments we can use.   I reviewed the past medical history, family history, social history, surgical history, and allergies today and no changes were needed.  Please see the problem list section below in epic for further details.  Past Medical History: Past Medical History:  Diagnosis Date   Allergic rhinitis    Anxiety    Asthma    Vitamin D deficiency    Past Surgical  History: Past Surgical History:  Procedure Laterality Date   NO PAST SURGERIES     Social History: Social History   Socioeconomic History   Marital status: Single    Spouse name: Not on file   Number of children: 1   Years of education: Not on file   Highest education level: Not on file  Occupational History   Not on file  Tobacco Use   Smoking status: Never   Smokeless tobacco: Never  Vaping Use   Vaping Use: Never used  Substance and Sexual Activity   Alcohol use: Yes    Comment: Occasionally   Drug use: Not Currently    Types: Marijuana   Sexual activity: Yes    Partners: Male    Birth control/protection: None, Coitus interruptus  Other Topics Concern   Not on file  Social History Narrative   Lives at home with boyfriend and daughter   Right handed   Caffeine: 1-4 times a week   Social Determinants of Health   Financial Resource Strain: Not on file  Food Insecurity: Not on file  Transportation Needs: Not on file  Physical Activity: Not on file  Stress: Not on file  Social Connections: Not on file   Family History: Family History  Problem Relation Age of Onset   Hypertension Father    Diabetes Father    Heart disease Father    High Cholesterol Father    Anxiety disorder Father    Allergic rhinitis Sister  Asthma Sister    Diabetes Maternal Grandmother    Angioedema Neg Hx    Eczema Neg Hx    Immunodeficiency Neg Hx    Urticaria Neg Hx    Allergies: No Known Allergies Medications: See med rec.  Review of Systems: See HPI for pertinent positives and negatives.   Objective:    General: Well Developed, well nourished, and in no acute distress.  Neuro: Alert and oriented x3.  HEENT: Normocephalic, atraumatic.  Skin: Warm and dry. Cardiac: Regular rate and rhythm, no murmurs rubs or gallops, no lower extremity edema.  Respiratory: Clear to auscultation bilaterally. Not using accessory muscles, speaking in full sentences. Right arm/hand: Normal  ROM and muscle strength. Neurovascularly intact with good capillary refill. Symptoms not reproducible today. Mild tenderness over the small nodules on the dorsal right forearm.   Impression and Recommendations:    1. Tremor of right hand 2. Subjective weakness 3. Right arm pain Symptoms not present and not reproducible today. Recent lab work with no concerns to explain symptoms. No cervical indication to suspect radicular symptoms. C-spine and brain MRIs reviewed with no findings to explain symptoms or prompt concern. Suspect some of these transient symptoms may be related to anxiety and the tendency to hyperfocus on simple sensations which then snowball into a larger issue. Cannot rule out other causes at this point but no symptoms present on exam to help direct further investigation. Advised patient to monitor symptoms and keep a small journal of dates/times and what she experiences over the next couple of weeks. Also recommend consideration of medication to help with anxiety that will not cause sedation. Reviewed various options to consider. Likely would benefit from an SSRI or SNRI if safe for use during breastfeeding. Since she equates the nodules on her extremities and trunk as a source of her discomfort, consulted Dr. Benjamin Stain for evaluation. See his note for recommendations.    Return if symptoms worsen or fail to improve. ___________________________________________ Thayer Ohm, DNP, APRN, FNP-BC Primary Care and Sports Medicine [facility] [place]

## 2021-02-20 NOTE — Progress Notes (Signed)
    Procedures performed today:    None.  Independent interpretation of notes and tests performed by another provider:   None.  Brief History, Exam, Impression, and Recommendations:    Multiple symmetrical lipomatosis This is a pleasant 40 year old female, she is noted multiple nodules under her skin, arms, thighs, back. She does endorse that is somewhat tender. On exam she has several well-defined, movable subcutaneous lesions consistent with lipomas. Really not tender to palpation. I have really discouraged her from surgical excision of these lesions though I will do it if she really wants me to. We will start with topical Pennsaid (diclofenac 2% topical), she tells [place] that ibuprofen makes her feel drunk. Return to see me as needed.    ___________________________________________ Ihor Austin. Benjamin Stain, M.D., ABFM., CAQSM. Primary Care and Sports Medicine [facility]  Adjunct Instructor of Family Medicine  [employer]

## 2021-02-25 ENCOUNTER — Ambulatory Visit (INDEPENDENT_AMBULATORY_CARE_PROVIDER_SITE_OTHER): Payer: 59

## 2021-02-25 ENCOUNTER — Encounter: Payer: Self-pay | Admitting: Medical-Surgical

## 2021-02-25 ENCOUNTER — Telehealth: Payer: Self-pay | Admitting: Allergy

## 2021-02-25 ENCOUNTER — Other Ambulatory Visit: Payer: Self-pay

## 2021-02-25 DIAGNOSIS — J309 Allergic rhinitis, unspecified: Secondary | ICD-10-CM

## 2021-02-25 MED ORDER — FLUTICASONE PROPIONATE 50 MCG/ACT NA SUSP: 2.0000 | Freq: Every day | NASAL | 0 refills | Status: DC

## 2021-02-25 MED ORDER — FLUTICASONE PROPIONATE 50 MCG/ACT NA SUSP: NASAL | 0 refills | Status: DC

## 2021-02-25 NOTE — Telephone Encounter (Signed)
Pt needs a refill on Fluticasone. Has an appt on 03/17/2021

## 2021-02-25 NOTE — Telephone Encounter (Signed)
Spoke with pt. Was notify of upcoming appt and keeping her appt for further refill. Rx Flonase sent in.

## 2021-02-25 NOTE — Telephone Encounter (Signed)
Sent in a courtesy refill for fluticasone nasal spray. One time only. Patient is aware that she needs to keep her appointment for further refills.

## 2021-03-08 ENCOUNTER — Encounter: Payer: Self-pay | Admitting: Medical-Surgical

## 2021-03-09 ENCOUNTER — Ambulatory Visit (INDEPENDENT_AMBULATORY_CARE_PROVIDER_SITE_OTHER): Payer: 59 | Admitting: *Deleted

## 2021-03-09 DIAGNOSIS — J309 Allergic rhinitis, unspecified: Secondary | ICD-10-CM

## 2021-03-11 ENCOUNTER — Encounter: Payer: Self-pay | Admitting: Medical-Surgical

## 2021-03-12 MED ORDER — HYDROXYZINE PAMOATE 25 MG PO CAPS: 25.0000 mg | ORAL_CAPSULE | Freq: Three times a day (TID) | ORAL | 0 refills | Status: AC | PRN

## 2021-03-17 ENCOUNTER — Ambulatory Visit (INDEPENDENT_AMBULATORY_CARE_PROVIDER_SITE_OTHER): Payer: 59 | Admitting: Family Medicine

## 2021-03-17 ENCOUNTER — Other Ambulatory Visit: Payer: Self-pay

## 2021-03-17 ENCOUNTER — Encounter: Payer: Self-pay | Admitting: Family Medicine

## 2021-03-17 VITALS — BP 110/66 | HR 70 | Temp 98.3°F | Resp 16 | Ht 60.43 in | Wt 159.6 lb

## 2021-03-17 DIAGNOSIS — J3089 Other allergic rhinitis: Secondary | ICD-10-CM | POA: Diagnosis not present

## 2021-03-17 DIAGNOSIS — H1013 Acute atopic conjunctivitis, bilateral: Secondary | ICD-10-CM

## 2021-03-17 DIAGNOSIS — J302 Other seasonal allergic rhinitis: Secondary | ICD-10-CM | POA: Diagnosis not present

## 2021-03-17 DIAGNOSIS — J4540 Moderate persistent asthma, uncomplicated: Secondary | ICD-10-CM

## 2021-03-17 MED ORDER — BUDESONIDE-FORMOTEROL FUMARATE 160-4.5 MCG/ACT IN AERO: INHALATION_SPRAY | RESPIRATORY_TRACT | 5 refills | Status: DC

## 2021-03-17 MED ORDER — MONTELUKAST SODIUM 10 MG PO TABS: ORAL_TABLET | ORAL | 5 refills | Status: DC

## 2021-03-17 MED ORDER — ALBUTEROL SULFATE HFA 108 (90 BASE) MCG/ACT IN AERS: 2.0000 | INHALATION_SPRAY | RESPIRATORY_TRACT | 1 refills | Status: AC | PRN

## 2021-03-17 MED ORDER — FLUTICASONE PROPIONATE 50 MCG/ACT NA SUSP: NASAL | 5 refills | Status: AC

## 2021-03-17 NOTE — Progress Notes (Addendum)
[address] [place] [address] Dept: [phone]  FOLLOW UP NOTE  Patient ID: Stacey Savage, female    DOB: 1981-03-17  Age: 40 y.o. MRN: [id number] Date of Office Visit: 03/17/2021  Assessment  Chief Complaint: Allergic Rhinitis  (Had sinus pressure about 2 weeks ago. Started back on her Flonase and Zytrec daily and it helped.) and Asthma (Doing well)  HPI Stacey Savage is a 40 year old female who presents to the clinic for follow-up visit.  She was last seen in this clinic on 05/08/2020 by Dr. Nunzio Cobbs for evaluation of asthma, allergic rhinitis, allergic conjunctivitis, and food allergy to tree nuts.  At today's visit, she reports her asthma has been moderately well controlled with occasional "heaviness" with her breathing.  She denies shortness of breath and cough with activity and rest.  She continues montelukast 10 mg once a day and reports that she uses Symbicort about 3 out of 7 days a week.  She is not currently using albuterol as this makes her feel jittery.  Allergic rhinitis is reported as moderately well controlled with occasional clear rhinorrhea, nasal congestion, and postnasal drainage.  She continues Flonase as needed and cetirizine as needed with relief of symptoms.  She continues allergen immunotherapy with no large or local reactions.  She reports a significant decrease in her symptoms of allergic rhinitis while continuing on allergen immunotherapy.  Allergic conjunctivitis is reported as moderately well controlled with occasional itch and clear drainage for which she is not currently using any medical intervention.  She continues to consume tree nuts with no adverse reaction.  Her current medications are listed in the chart. Of note, she continues to breastfeed her child.   Drug Allergies:  No Known Allergies  Physical Exam: BP 110/66   Pulse 70   Temp 98.3 F (36.8 C) (Temporal)   Resp 16   Ht 5' 0.43" (1.535 m)   Wt 159 lb 9.6 oz (72.4 kg)   SpO2  98%   BMI 30.72 kg/m    Physical Exam Vitals reviewed.  Constitutional:      Appearance: Normal appearance.  HENT:     Head: Normocephalic and atraumatic.     Right Ear: Tympanic membrane normal.     Left Ear: Tympanic membrane normal.     Nose:     Comments: Bilateral nares normal.  Pharynx normal.  Ears normal.  Eyes normal.    Mouth/Throat:     Pharynx: Oropharynx is clear.  Eyes:     Conjunctiva/sclera: Conjunctivae normal.  Cardiovascular:     Rate and Rhythm: Normal rate and regular rhythm.     Heart sounds: Normal heart sounds. No murmur heard. Pulmonary:     Effort: Pulmonary effort is normal.     Breath sounds: Normal breath sounds.     Comments: Lungs clear to auscultation Musculoskeletal:        General: Normal range of motion.     Cervical back: Normal range of motion and neck supple.  Skin:    General: Skin is warm and dry.  Neurological:     Mental Status: She is alert and oriented to person, place, and time.  Psychiatric:        Mood and Affect: Mood normal.        Behavior: Behavior normal.        Thought Content: Thought content normal.        Judgment: Judgment normal.    Diagnostics: FVC 2.68, FEV1 2.32.  Predicted FVC 3.20.  Predicted FEV1 2.67.  Spirometry indicates normal ventilatory function.  Assessment and Plan: 1. Moderate persistent asthma, uncomplicated   2. Seasonal and perennial allergic rhinitis   3. Allergic conjunctivitis of both eyes     Meds ordered this encounter  Medications   albuterol (VENTOLIN HFA) 108 (90 Base) MCG/ACT inhaler    Sig: Inhale 2 puffs into the lungs every 4 (four) hours as needed for wheezing or shortness of breath.    Dispense:  18 each    Refill:  1   montelukast (SINGULAIR) 10 MG tablet    Sig: TAKE 1 TABLET BY MOUTH ONCE DAILY    Dispense:  30 tablet    Refill:  5   fluticasone (FLONASE) 50 MCG/ACT nasal spray    Sig: 1-2 sprayS per nostril daily as needed.    Dispense:  16 g    Refill:  5    budesonide-formoterol (SYMBICORT) 160-4.5 MCG/ACT inhaler    Sig: 2 puffs twice daily with spacer to prevent coughing or wheezing. Rinse,gargle and spit after use.    Dispense:  1 each    Refill:  5     Patient Instructions  Asthma Begin Symbicort 160-2 puffs once a day with a spacer to prevent cough or wheeze Continue montelukast 10 mg once a day to prevent cough or wheeze Continue albuterol 2 puffs once every 4 hours as needed for cough or wheeze You may use albuterol 2 puffs 5 to 15 minutes before activity to decrease cough or wheeze For asthma flare, increase Symbicort 160 to 2 puffs twice a day with a spacer and call the clinic  Allergic rhinitis Continue allergen avoidance measures directed toward grass pollen, tree pollen, weed pollen, dust mite, cat, dog, rabbit, mouse, and cockroach as listed below Continue allergen immunotherapy and have access to an epinephrine autoinjector set Continue Flonase 1 to 2 sprays in each nostril once a day as needed for stuffy nose Consider saline nasal rinses as needed for nasal symptoms. Use this before any medicated nasal sprays for best result  Allergic conjunctivitis Some over the counter eye drops include Pataday one drop in each eye once a day as needed for red, itchy eyes OR Zaditor one drop in each eye twice a day as needed for red itchy eyes.  Call the clinic if this treatment plan is not working well for you.  Follow up in 6 months or sooner if needed.  Return in about 6 months (around 09/17/2021), or if symptoms worsen or fail to improve.    Thank you for the opportunity to care for this patient.  Please do not hesitate to contact me with questions.  Thermon Leyland, FNP [facility]  I have provided oversight concerning Thermon Leyland' evaluation and treatment of this patient's health issues addressed during today's encounter. I agree with the assessment and therapeutic plan as outlined in the note.    Signed,   Jessica Priest, MD,  Allergy and Immunology,  [facility] [place].

## 2021-03-17 NOTE — Patient Instructions (Addendum)
Asthma Begin Symbicort 160-2 puffs once a day with a spacer to prevent cough or wheeze. Continue montelukast 10 mg once a day to prevent cough or wheeze Continue albuterol 2 puffs once every 4 hours as needed for cough or wheeze You may use albuterol 2 puffs 5 to 15 minutes before activity to decrease cough or wheeze For asthma flare, increase Symbicort 160 to 2 puffs twice a day with a spacer and call the clinic  Allergic rhinitis Continue allergen avoidance measures directed toward grass pollen, tree pollen, weed pollen, dust mite, cat, dog, rabbit, mouse, and cockroach as listed below Continue allergen immunotherapy and have access to an epinephrine autoinjector set Continue Flonase 1 to 2 sprays in each nostril once a day as needed for stuffy nose Consider saline nasal rinses as needed for nasal symptoms. Use this before any medicated nasal sprays for best result  Allergic conjunctivitis Some over the counter eye drops include Pataday one drop in each eye once a day as needed for red, itchy eyes OR Zaditor one drop in each eye twice a day as needed for red itchy eyes.  Call the clinic if this treatment plan is not working well for you.  Follow up in 6 months or sooner if needed.  Reducing Pollen Exposure The American Academy of Allergy, Asthma and Immunology suggests the following steps to reduce your exposure to pollen during allergy seasons. Do not hang sheets or clothing out to dry; pollen may collect on these items. Do not mow lawns or spend time around freshly cut grass; mowing stirs up pollen. Keep windows closed at night.  Keep car windows closed while driving. Minimize morning activities outdoors, a time when pollen counts are usually at their highest. Stay indoors as much as possible when pollen counts or humidity is high and on windy days when pollen tends to remain in the air longer. Use air conditioning when possible.  Many air conditioners have filters that trap the pollen  spores. Use a HEPA room air filter to remove pollen form the indoor air you breathe.   Control of Dust Mite Allergen Dust mites play a major role in allergic asthma and rhinitis. They occur in environments with high humidity wherever human skin is found. Dust mites absorb humidity from the atmosphere (ie, they do not drink) and feed on organic matter (including shed human and animal skin). Dust mites are a microscopic type of insect that you cannot see with the naked eye. High levels of dust mites have been detected from mattresses, pillows, carpets, upholstered furniture, bed covers, clothes, soft toys and any woven material. The principal allergen of the dust mite is found in its feces. A gram of dust may contain 1,000 mites and 250,000 fecal particles. Mite antigen is easily measured in the air during house cleaning activities. Dust mites do not bite and do not cause harm to humans, other than by triggering allergies/asthma.  Ways to decrease your exposure to dust mites in your home:  1. Encase mattresses, box springs and pillows with a mite-impermeable barrier or cover  2. Wash sheets, blankets and drapes weekly in hot water (130 F) with detergent and dry them in a dryer on the hot setting.  3. Have the room cleaned frequently with a vacuum cleaner and a damp dust-mop. For carpeting or rugs, vacuuming with a vacuum cleaner equipped with a high-efficiency particulate air (HEPA) filter. The dust mite allergic individual should not be in a room which is being cleaned and should  wait 1 hour after cleaning before going into the room.  4. Do not sleep on upholstered furniture (eg, couches).  5. If possible removing carpeting, upholstered furniture and drapery from the home is ideal. Horizontal blinds should be eliminated in the rooms where the person spends the most time (bedroom, study, television room). Washable vinyl, roller-type shades are optimal.  6. Remove all non-washable stuffed toys from  the bedroom. Wash stuffed toys weekly like sheets and blankets above.  7. Reduce indoor humidity to less than 50%. Inexpensive humidity monitors can be purchased at most hardware stores. Do not use a humidifier as can make the problem worse and are not recommended.  Control of Dog or Cat Allergen Avoidance is the best way to manage a dog or cat allergy. If you have a dog or cat and are allergic to dog or cats, consider removing the dog or cat from the home. If you have a dog or cat but don't want to find it a new home, or if your family wants a pet even though someone in the household is allergic, here are some strategies that may help keep symptoms at bay:  Keep the pet out of your bedroom and restrict it to only a few rooms. Be advised that keeping the dog or cat in only one room will not limit the allergens to that room. Don't pet, hug or kiss the dog or cat; if you do, wash your hands with soap and water. High-efficiency particulate air (HEPA) cleaners run continuously in a bedroom or living room can reduce allergen levels over time. Regular use of a high-efficiency vacuum cleaner or a central vacuum can reduce allergen levels. Giving your dog or cat a bath at least once a week can reduce airborne allergen. Control of Mold Allergen Mold and fungi can grow on a variety of surfaces provided certain temperature and moisture conditions exist.  Outdoor molds grow on plants, decaying vegetation and soil.  The major outdoor mold, Alternaria and Cladosporium, are found in very high numbers during hot and dry conditions.  Generally, a late Summer - Fall peak is seen for common outdoor fungal spores.  Rain will temporarily lower outdoor mold spore count, but counts rise rapidly when the rainy period ends.  The most important indoor molds are Aspergillus and Penicillium.  Dark, humid and poorly ventilated basements are ideal sites for mold growth.  The next most common sites of mold growth are the bathroom  and the kitchen.  Outdoor [employer] Use air conditioning and keep windows closed Avoid exposure to decaying vegetation. Avoid leaf raking. Avoid grain handling. Consider wearing a face mask if working in moldy areas.  Indoor Mold Control Maintain humidity below 50%. Clean washable surfaces with 5% bleach solution. Remove sources e.g. Contaminated carpets.  Control of Cockroach Allergen Cockroach allergen has been identified as an important cause of acute attacks of asthma, especially in urban settings.  There are fifty-five species of cockroach that exist in the [place], however only three, the [place], [place] species produce allergen that can affect patients with Asthma.  Allergens can be obtained from fecal particles, egg casings and secretions from cockroaches.    Remove food sources. Reduce access to water. Seal access and entry points. Spray runways with 0.5-1% Diazinon or Chlorpyrifos Blow boric acid power under stoves and refrigerator. Place bait stations (hydramethylnon) at feeding sites.

## 2021-03-18 ENCOUNTER — Ambulatory Visit (INDEPENDENT_AMBULATORY_CARE_PROVIDER_SITE_OTHER): Payer: 59

## 2021-03-18 DIAGNOSIS — J309 Allergic rhinitis, unspecified: Secondary | ICD-10-CM

## 2021-03-21 ENCOUNTER — Other Ambulatory Visit: Payer: Self-pay | Admitting: Allergy

## 2021-03-24 ENCOUNTER — Telehealth: Payer: Self-pay

## 2021-03-24 NOTE — Telephone Encounter (Signed)
Patient was last seen 03/17/21 Flonase has been sent through [facility] with 5 refills.

## 2021-03-25 ENCOUNTER — Ambulatory Visit (INDEPENDENT_AMBULATORY_CARE_PROVIDER_SITE_OTHER): Payer: 59

## 2021-03-25 DIAGNOSIS — J309 Allergic rhinitis, unspecified: Secondary | ICD-10-CM

## 2021-03-31 ENCOUNTER — Ambulatory Visit (INDEPENDENT_AMBULATORY_CARE_PROVIDER_SITE_OTHER): Payer: 59

## 2021-03-31 DIAGNOSIS — J309 Allergic rhinitis, unspecified: Secondary | ICD-10-CM | POA: Diagnosis not present

## 2021-04-10 ENCOUNTER — Ambulatory Visit (INDEPENDENT_AMBULATORY_CARE_PROVIDER_SITE_OTHER): Payer: 59 | Admitting: *Deleted

## 2021-04-10 DIAGNOSIS — J309 Allergic rhinitis, unspecified: Secondary | ICD-10-CM | POA: Diagnosis not present

## 2021-04-13 ENCOUNTER — Other Ambulatory Visit: Payer: Self-pay

## 2021-04-15 ENCOUNTER — Other Ambulatory Visit: Payer: Self-pay

## 2021-04-15 ENCOUNTER — Ambulatory Visit (INDEPENDENT_AMBULATORY_CARE_PROVIDER_SITE_OTHER): Payer: 59

## 2021-04-15 DIAGNOSIS — J309 Allergic rhinitis, unspecified: Secondary | ICD-10-CM | POA: Diagnosis not present

## 2021-04-15 MED ORDER — CETIRIZINE HCL 10 MG PO TABS: ORAL_TABLET | ORAL | 1 refills | Status: AC

## 2021-04-21 ENCOUNTER — Ambulatory Visit (INDEPENDENT_AMBULATORY_CARE_PROVIDER_SITE_OTHER): Payer: 59

## 2021-04-21 DIAGNOSIS — J309 Allergic rhinitis, unspecified: Secondary | ICD-10-CM | POA: Diagnosis not present

## 2021-04-25 ENCOUNTER — Encounter: Payer: Self-pay | Admitting: Medical-Surgical

## 2021-04-29 ENCOUNTER — Ambulatory Visit (INDEPENDENT_AMBULATORY_CARE_PROVIDER_SITE_OTHER): Payer: 59

## 2021-04-29 ENCOUNTER — Telehealth: Payer: Self-pay

## 2021-04-29 DIAGNOSIS — J309 Allergic rhinitis, unspecified: Secondary | ICD-10-CM

## 2021-04-29 NOTE — Telephone Encounter (Signed)
Patient came in for her injections today. She stated she will come in next week for last injections with [place] because she just found out she is moving to [facility] or [place].  She signed the  dno sheet an the [facility] consent if we need to fax records to her new allergist when she finds one.

## 2021-05-05 ENCOUNTER — Ambulatory Visit (INDEPENDENT_AMBULATORY_CARE_PROVIDER_SITE_OTHER): Payer: 59

## 2021-05-05 DIAGNOSIS — J309 Allergic rhinitis, unspecified: Secondary | ICD-10-CM | POA: Diagnosis not present

## 2021-05-06 ENCOUNTER — Ambulatory Visit: Payer: 59 | Admitting: Medical-Surgical

## 2021-05-06 ENCOUNTER — Encounter: Payer: Self-pay | Admitting: Medical-Surgical

## 2021-05-06 VITALS — BP 127/80 | HR 85 | Resp 20 | Ht 60.43 in | Wt 163.0 lb

## 2021-05-06 DIAGNOSIS — Z23 Encounter for immunization: Secondary | ICD-10-CM | POA: Diagnosis not present

## 2021-05-06 DIAGNOSIS — Z711 Person with feared health complaint in whom no diagnosis is made: Secondary | ICD-10-CM | POA: Diagnosis not present

## 2021-05-06 DIAGNOSIS — R0781 Pleurodynia: Secondary | ICD-10-CM

## 2021-05-06 NOTE — Progress Notes (Signed)
  HPI with pertinent ROS:   CC: Rib pain  HPI: Pleasant 40 year old female presenting today for evaluation of left-sided rib pain.  She has noticed this for the last couple of weeks on the left side just under her breast to the edge of her ribs.  Pain is reproducible on palpation.  She has had no injuries and no changes in activity.  Today, her pain is completely resolved but she remains worried that there may be something wrong.  She reports that she and her partner will be moving to [place] over the weekend for her partner's new job.  This has caused a lot of stress recently and her anxiety level has been high.  She has been having random back pains but seems to be improving now that things are settling into a workable plan.  I reviewed the past medical history, family history, social history, surgical history, and allergies today and no changes were needed.  Please see the problem list section below in epic for further details.   Physical exam:   General: Well Developed, well nourished, and in no acute distress.  Neuro: Alert and oriented x3.  HEENT: Normocephalic, atraumatic.  Skin: Warm and dry. Cardiac: Regular rate and rhythm, no murmurs rubs or gallops, no lower extremity edema.  Respiratory: Clear to auscultation bilaterally. Not using accessory muscles, speaking in full sentences.  Impression and Recommendations:    1. Rib pain Resolved today.  This appears to have been a simple costochondritis rather than anything severe.  Recommend using conservative measures such as ice, heat, Tylenol, ibuprofen, stretching, and massage should this recur.  During her move, recommend that she avoid heavy lifting and make sure she takes frequent rest periods to prevent worsening.  2. Worried well Ultimately, she has a lot of health-related anxiety which can worsen if not be the cause of a lot of her somatic complaints.  Would strongly recommend getting established with a PCP in [place] to be  evaluated for benefit of treatment with maintenance medications to help reduce her anxiety.  3. Need for influenza vaccination Flu vaccine given in office today. - Flu Vaccine QUAD 66mo+IM (Fluarix, Fluzone & Alfiuria Quad PF)  Return if symptoms worsen or fail to improve. ___________________________________________ Thayer Ohm, DNP, APRN, FNP-BC Primary Care and Sports Medicine [facility]

## 2021-05-07 ENCOUNTER — Other Ambulatory Visit: Payer: Self-pay

## 2021-05-07 MED ORDER — BUDESONIDE-FORMOTEROL FUMARATE 160-4.5 MCG/ACT IN AERO: INHALATION_SPRAY | RESPIRATORY_TRACT | 3 refills | Status: AC

## 2022-02-17 IMAGING — CR DG CHEST 2V
2 series · 2 of 2 positions shown · non-contrast
Comparison: 03/23/2020

CLINICAL DATA: Chest pain, left rib pain

EXAM:
CHEST - 2 VIEW

[w chest pa]
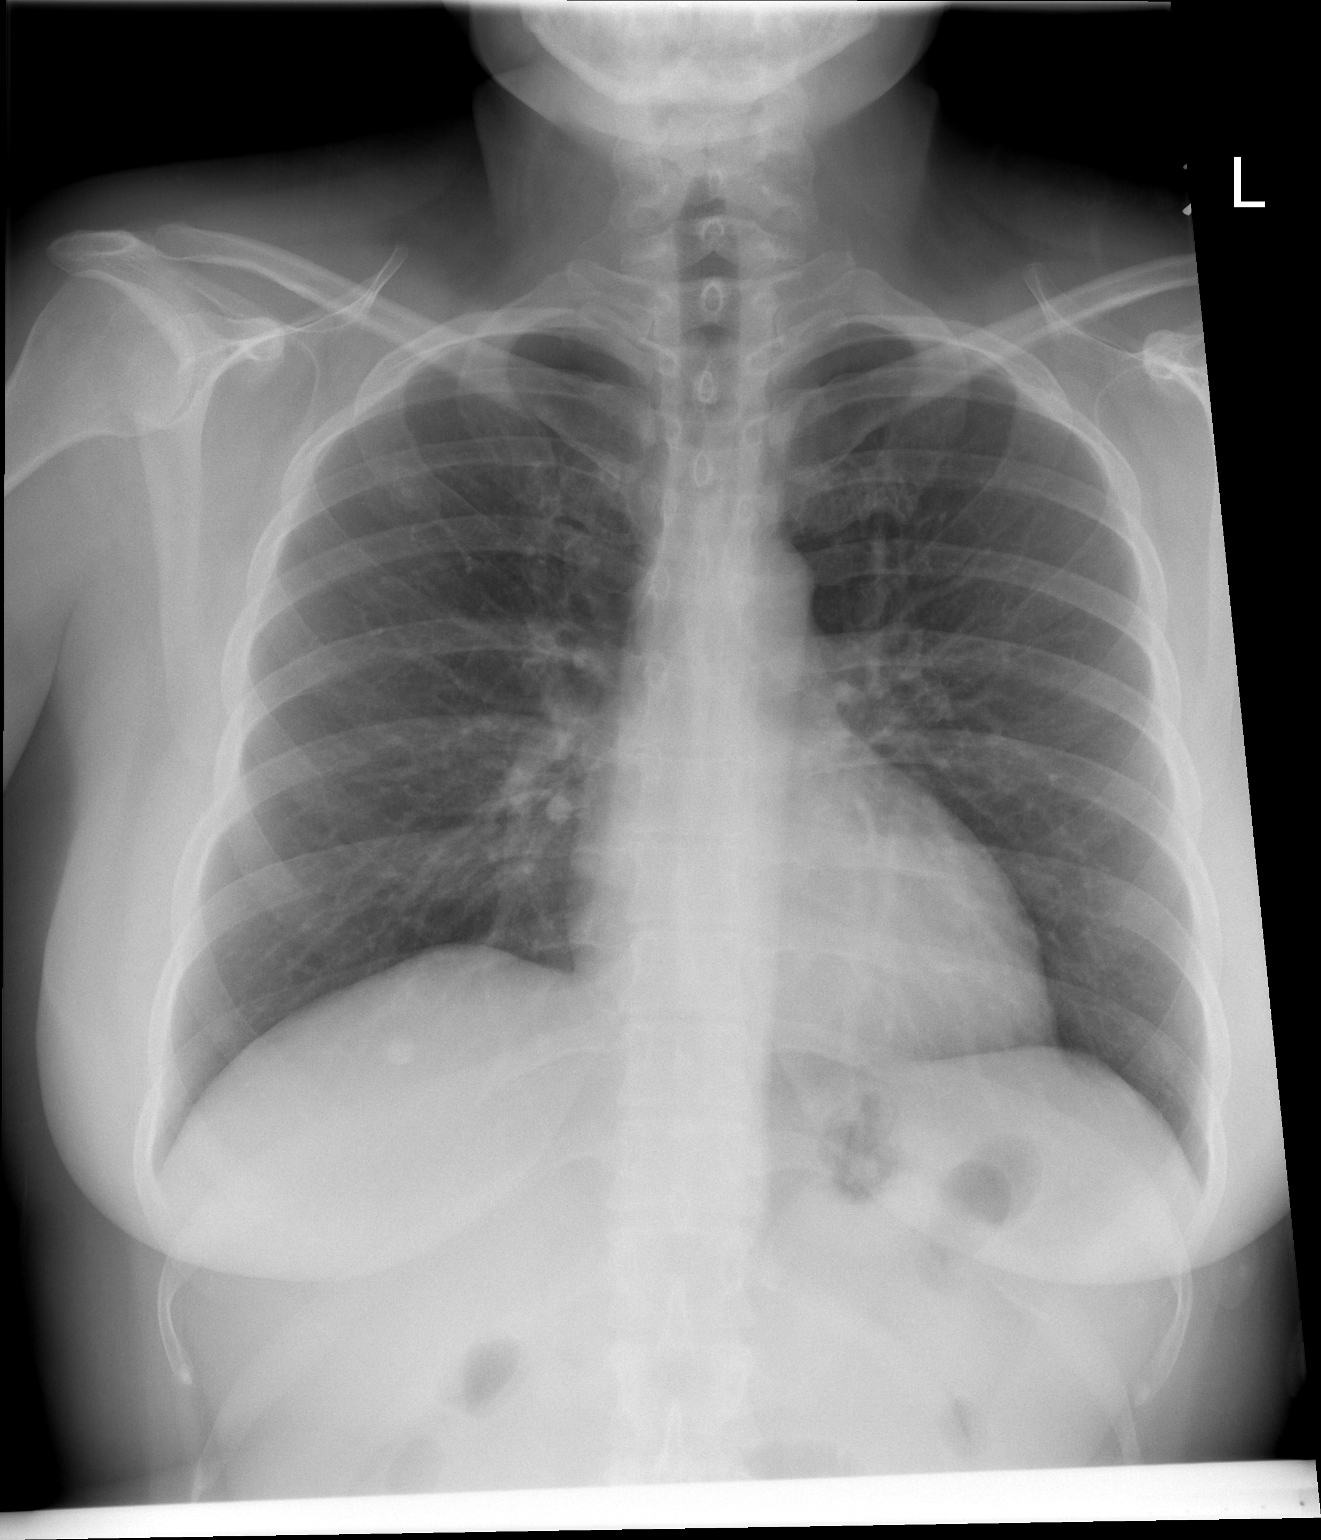

[w chest lat]
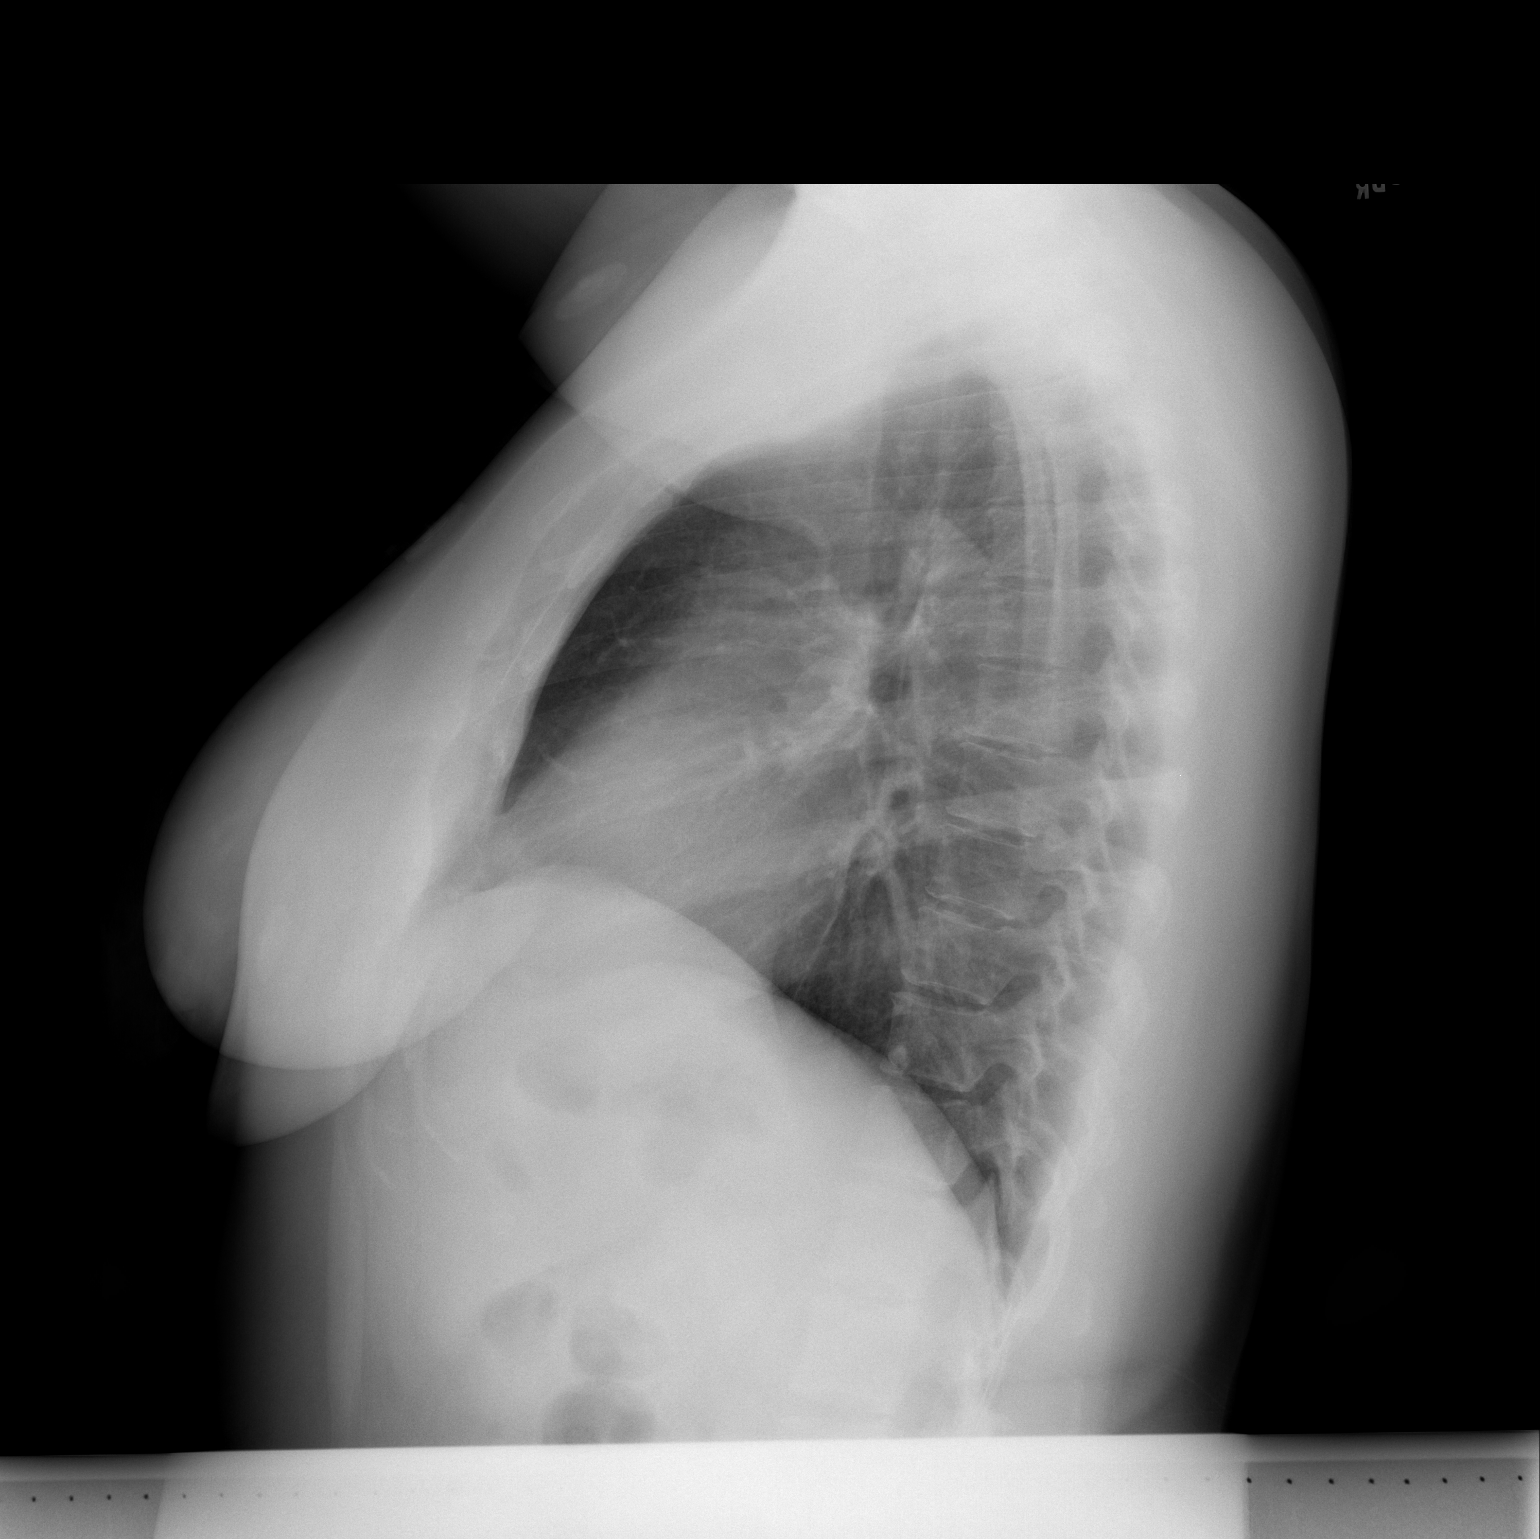

[2 of 2 positions shown; findings below may reference images not displayed]

FINDINGS: Calcified granuloma at the right lung base. Lungs otherwise clear.
Heart is normal size. No effusions or acute bony abnormality.
IMPRESSION: No active cardiopulmonary disease.

## 2022-08-14 IMAGING — DX DG FEMUR 2+V*L*
4 series · 4 of 4 positions shown · non-contrast
Comparison: None.

CLINICAL DATA: Intermittent left thigh pain for 3 weeks. No
reported injury.

EXAM:
LEFT FEMUR 2 VIEWS

[femur ap (1 of 2)]
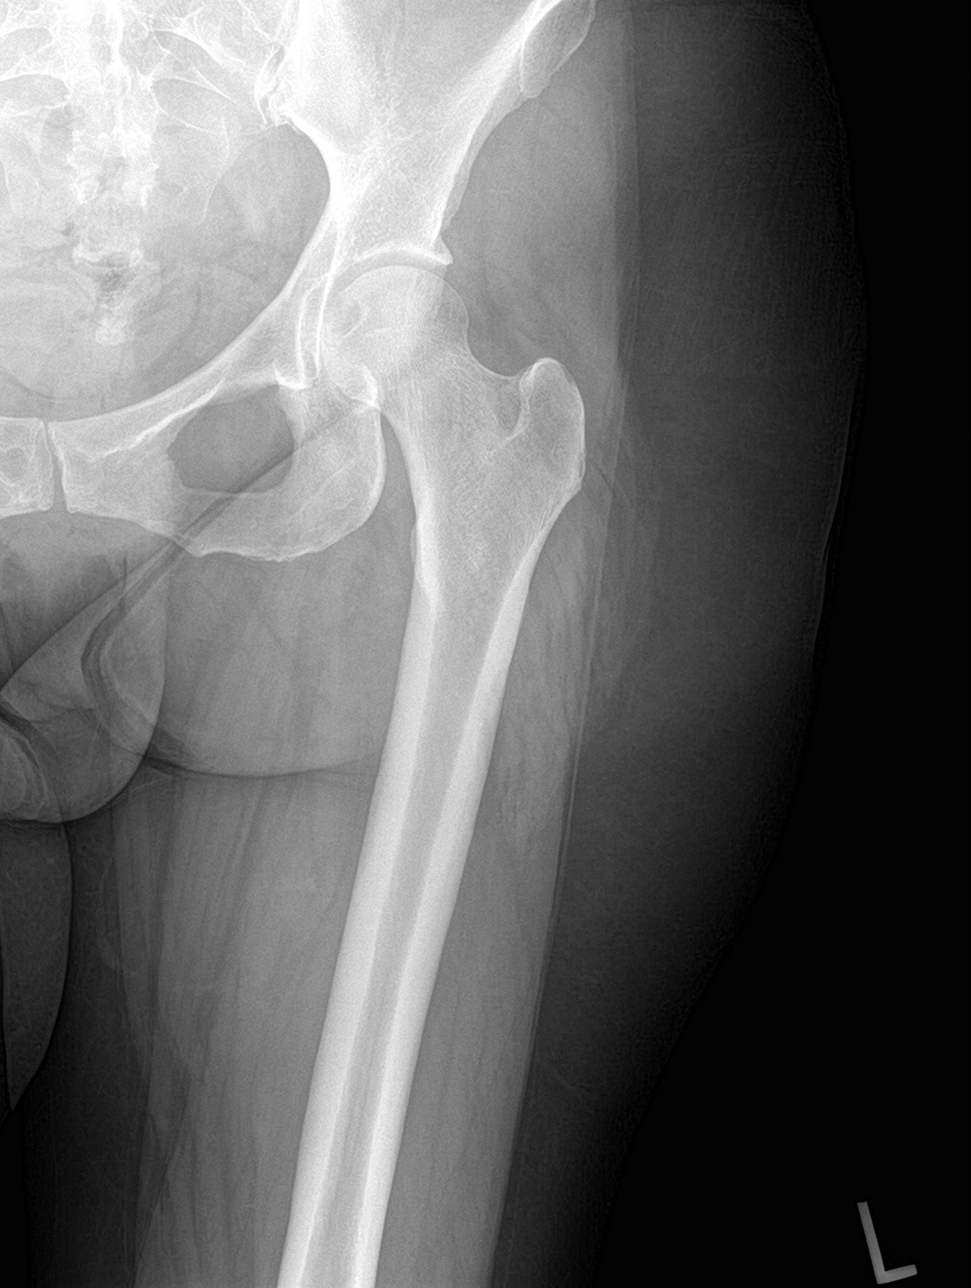

[femur ap (2 of 2)]
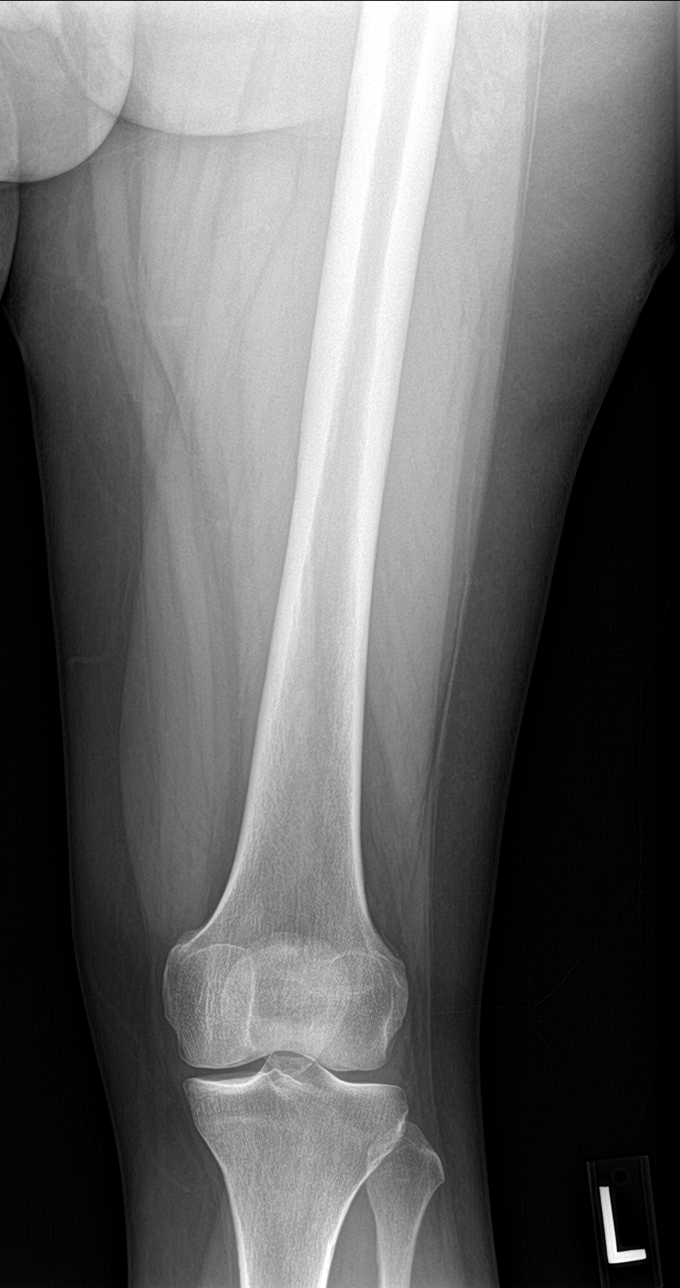

[femur lat (1 of 2)]
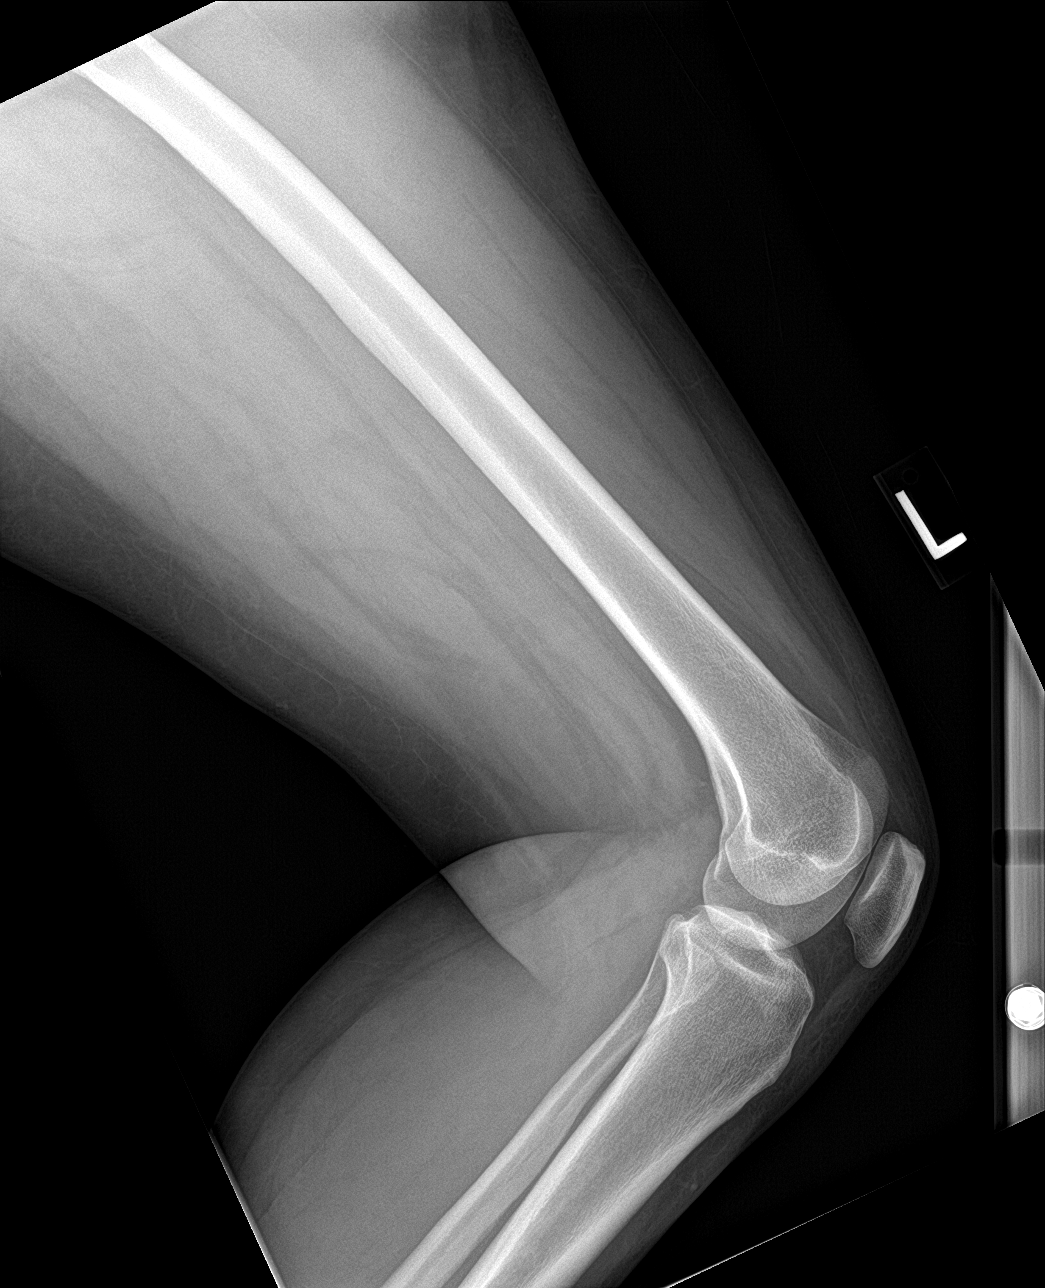

[femur lat (2 of 2)]
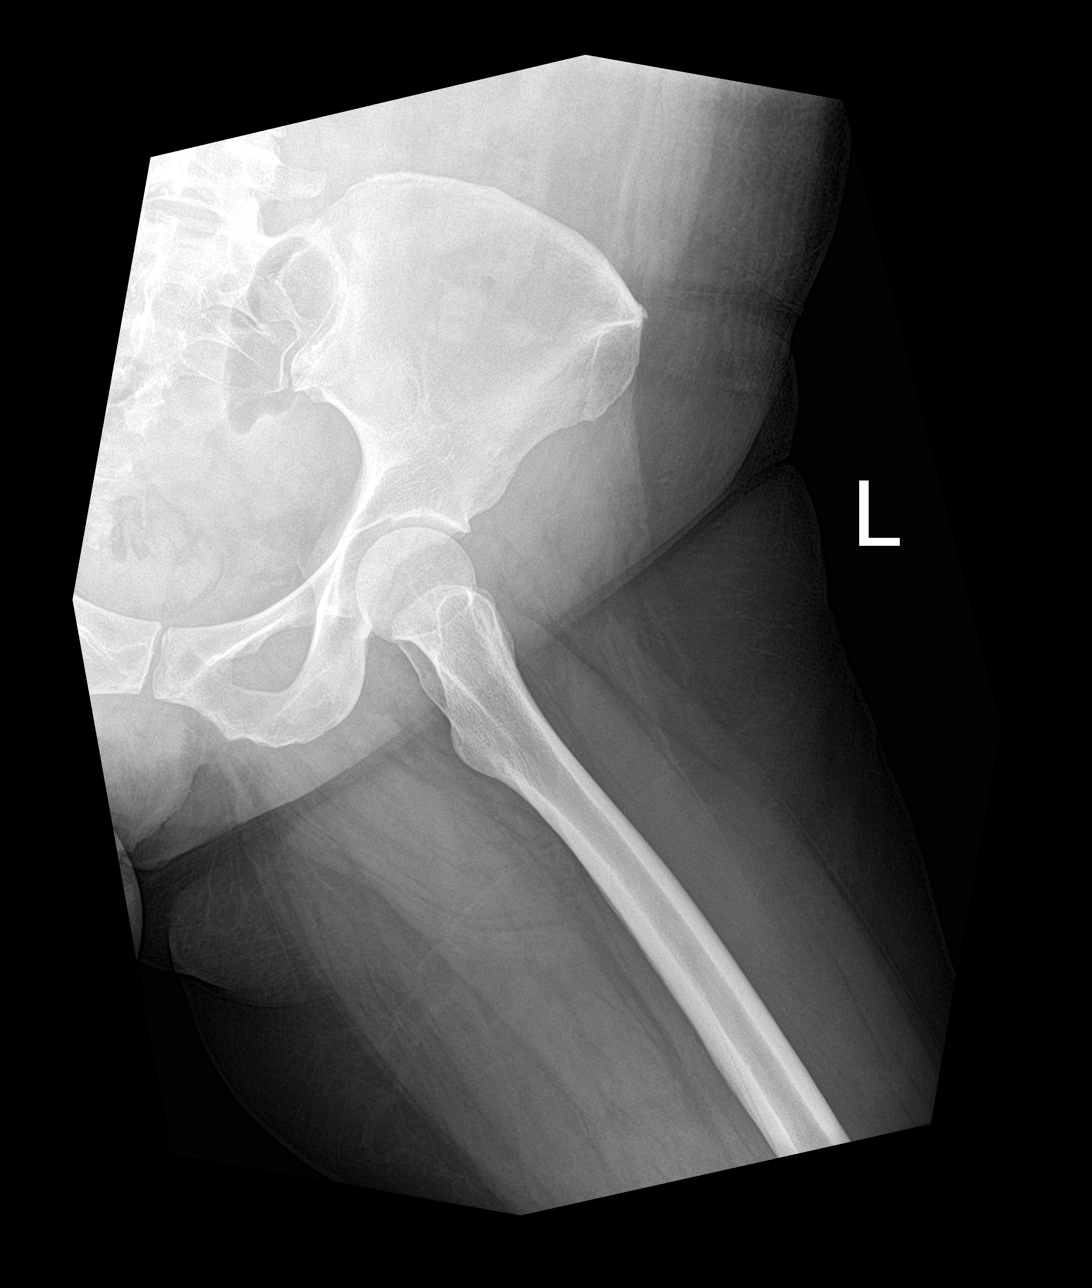

[4 of 4 positions shown; findings below may reference images not displayed]

FINDINGS: No fracture. No focal osseous lesions. No periosteal reaction,
abnormal sclerosis or erosions. No evidence of malalignment at the
left knee or left hip on these views. No radiopaque foreign bodies.
IMPRESSION: No osseous abnormality.

## 2022-10-25 ENCOUNTER — Telehealth: Payer: Self-pay | Admitting: General Practice

## 2022-10-25 NOTE — Transitions of Care (Post Inpatient/ED Visit) (Signed)
   10/25/2022  Name: Stacey Savage MRN: [id number] DOB: [birth year]  Today's TOC FU Call Status: Today's TOC FU Call Status:: Successful TOC FU Call Competed TOC FU Call Complete Date: 10/25/22  Called patient to completed [facility] call however patient has moved to [place] and has a different PCP.    SIGNATURE: Tinnie Gens, RN BSN
